# Patient Record
Sex: Male | Born: 1965 | Race: White | Hispanic: No
Health system: Southern US, Community
[De-identification: ages and names within clinical notes are randomized; demographics above are authoritative.]

---

## 2021-09-07 ENCOUNTER — Other Ambulatory Visit (HOSPITAL_COMMUNITY): Payer: Medicaid Other

## 2021-09-07 ENCOUNTER — Institutional Professional Consult (permissible substitution)
Admit: 2021-09-07 | Discharge: 2021-10-18 | Disposition: A | Discharge status: Rehabilitation Facility | Payer: Medicaid Other | Source: Other Acute Inpatient Hospital

## 2021-09-07 DIAGNOSIS — F10139 Alcohol abuse with withdrawal, unspecified: Secondary | ICD-10-CM

## 2021-09-07 DIAGNOSIS — J690 Pneumonitis due to inhalation of food and vomit: Secondary | ICD-10-CM

## 2021-09-07 DIAGNOSIS — Z931 Gastrostomy status: Secondary | ICD-10-CM

## 2021-09-07 DIAGNOSIS — T81507A Unspecified complication of foreign body accidentally left in body following removal of catheter or packing, initial encounter: Secondary | ICD-10-CM

## 2021-09-07 DIAGNOSIS — T17908A Unspecified foreign body in respiratory tract, part unspecified causing other injury, initial encounter: Secondary | ICD-10-CM

## 2021-09-07 DIAGNOSIS — R609 Edema, unspecified: Secondary | ICD-10-CM

## 2021-09-07 DIAGNOSIS — K7460 Unspecified cirrhosis of liver: Secondary | ICD-10-CM

## 2021-09-07 DIAGNOSIS — K567 Ileus, unspecified: Secondary | ICD-10-CM

## 2021-09-07 DIAGNOSIS — Z4659 Encounter for fitting and adjustment of other gastrointestinal appliance and device: Secondary | ICD-10-CM

## 2021-09-07 DIAGNOSIS — R188 Other ascites: Secondary | ICD-10-CM

## 2021-09-07 DIAGNOSIS — J189 Pneumonia, unspecified organism: Secondary | ICD-10-CM

## 2021-09-07 DIAGNOSIS — T85598A Other mechanical complication of other gastrointestinal prosthetic devices, implants and grafts, initial encounter: Secondary | ICD-10-CM

## 2021-09-07 DIAGNOSIS — R52 Pain, unspecified: Secondary | ICD-10-CM

## 2021-09-07 DIAGNOSIS — R110 Nausea: Secondary | ICD-10-CM

## 2021-09-07 DIAGNOSIS — G9341 Metabolic encephalopathy: Secondary | ICD-10-CM

## 2021-09-07 DIAGNOSIS — J9621 Acute and chronic respiratory failure with hypoxia: Secondary | ICD-10-CM

## 2021-09-07 DIAGNOSIS — Z978 Presence of other specified devices: Secondary | ICD-10-CM

## 2021-09-07 DIAGNOSIS — R1110 Vomiting, unspecified: Secondary | ICD-10-CM

## 2021-09-07 DIAGNOSIS — R079 Chest pain, unspecified: Secondary | ICD-10-CM

## 2021-09-07 DIAGNOSIS — J9690 Respiratory failure, unspecified, unspecified whether with hypoxia or hypercapnia: Secondary | ICD-10-CM

## 2021-09-07 DIAGNOSIS — K7031 Alcoholic cirrhosis of liver with ascites: Secondary | ICD-10-CM

## 2021-09-07 DIAGNOSIS — R112 Nausea with vomiting, unspecified: Secondary | ICD-10-CM

## 2021-09-07 LAB — BLOOD GAS, ARTERIAL
Acid-base deficit: 5.2 mmol/L — ABNORMAL HIGH (ref 0.0–2.0)
Bicarbonate: 18.5 mmol/L — ABNORMAL LOW (ref 20.0–28.0)
FIO2: 40
O2 Saturation: 98 %
Patient temperature: 37.4
pCO2 arterial: 30.1 mmHg — ABNORMAL LOW (ref 32.0–48.0)
pH, Arterial: 7.409 (ref 7.350–7.450)
pO2, Arterial: 105 mmHg (ref 83.0–108.0)

## 2021-09-07 MED ORDER — DIATRIZOATE MEGLUMINE & SODIUM 66-10 % PO SOLN
ORAL | Status: AC
  Filled 2021-09-07: qty 30

## 2021-09-08 ENCOUNTER — Other Ambulatory Visit (HOSPITAL_COMMUNITY): Payer: Medicaid Other

## 2021-09-08 DIAGNOSIS — J9621 Acute and chronic respiratory failure with hypoxia: Secondary | ICD-10-CM | POA: Diagnosis not present

## 2021-09-08 DIAGNOSIS — G9341 Metabolic encephalopathy: Secondary | ICD-10-CM

## 2021-09-08 DIAGNOSIS — K7031 Alcoholic cirrhosis of liver with ascites: Secondary | ICD-10-CM

## 2021-09-08 DIAGNOSIS — F10139 Alcohol abuse with withdrawal, unspecified: Secondary | ICD-10-CM

## 2021-09-08 LAB — CBC WITH DIFFERENTIAL/PLATELET
Abs Immature Granulocytes: 0.05 10*3/uL (ref 0.00–0.07)
Basophils Absolute: 0.1 10*3/uL (ref 0.0–0.1)
Basophils Relative: 1 %
Eosinophils Absolute: 0.7 10*3/uL — ABNORMAL HIGH (ref 0.0–0.5)
Eosinophils Relative: 7 %
HCT: 26.2 % — ABNORMAL LOW (ref 39.0–52.0)
Hemoglobin: 8.2 g/dL — ABNORMAL LOW (ref 13.0–17.0)
Immature Granulocytes: 1 %
Lymphocytes Relative: 15 %
Lymphs Abs: 1.5 10*3/uL (ref 0.7–4.0)
MCH: 29.8 pg (ref 26.0–34.0)
MCHC: 31.3 g/dL (ref 30.0–36.0)
MCV: 95.3 fL (ref 80.0–100.0)
Monocytes Absolute: 1.3 10*3/uL — ABNORMAL HIGH (ref 0.1–1.0)
Monocytes Relative: 13 %
Neutro Abs: 6.7 10*3/uL (ref 1.7–7.7)
Neutrophils Relative %: 63 %
Platelets: 324 10*3/uL (ref 150–400)
RBC: 2.75 MIL/uL — ABNORMAL LOW (ref 4.22–5.81)
RDW: 19.1 % — ABNORMAL HIGH (ref 11.5–15.5)
WBC: 10.4 10*3/uL (ref 4.0–10.5)
nRBC: 0 % (ref 0.0–0.2)

## 2021-09-08 LAB — COMPREHENSIVE METABOLIC PANEL
ALT: 49 U/L — ABNORMAL HIGH (ref 0–44)
AST: 55 U/L — ABNORMAL HIGH (ref 15–41)
Albumin: 2 g/dL — ABNORMAL LOW (ref 3.5–5.0)
Alkaline Phosphatase: 225 U/L — ABNORMAL HIGH (ref 38–126)
Anion gap: 8 (ref 5–15)
BUN: 38 mg/dL — ABNORMAL HIGH (ref 6–20)
CO2: 20 mmol/L — ABNORMAL LOW (ref 22–32)
Calcium: 8.6 mg/dL — ABNORMAL LOW (ref 8.9–10.3)
Chloride: 116 mmol/L — ABNORMAL HIGH (ref 98–111)
Creatinine, Ser: 0.83 mg/dL (ref 0.61–1.24)
GFR, Estimated: >60 mL/min (ref >60)
Glucose, Bld: 107 mg/dL — ABNORMAL HIGH (ref 70–99)
Potassium: 3.6 mmol/L (ref 3.5–5.1)
Sodium: 144 mmol/L (ref 135–145)
Total Bilirubin: 0.7 mg/dL (ref 0.3–1.2)
Total Protein: 6.3 g/dL — ABNORMAL LOW (ref 6.5–8.1)

## 2021-09-08 LAB — HEMOGLOBIN A1C
Hgb A1c MFr Bld: 5.8 % — ABNORMAL HIGH (ref 4.8–5.6)
Mean Plasma Glucose: 119.76 mg/dL

## 2021-09-08 LAB — PHOSPHORUS: Phosphorus: 4.1 mg/dL (ref 2.5–4.6)

## 2021-09-08 LAB — MAGNESIUM: Magnesium: 1.9 mg/dL (ref 1.7–2.4)

## 2021-09-08 LAB — PROTIME-INR
INR: 1.2 (ref 0.8–1.2)
Prothrombin Time: 15.5 seconds — ABNORMAL HIGH (ref 11.4–15.2)

## 2021-09-08 LAB — AMMONIA: Ammonia: 27 umol/L (ref 9–35)

## 2021-09-08 NOTE — Consult Note (Signed)
WhoPulmonary Critical Care Medicine [facility] [place]  PULMONARY SERVICE  Date of Service: 09/08/2021  PULMONARY CRITICAL CARE Leon Davidson  BOF:[id number]  DOB: 1965-12-24   DOA: 09/07/2021  Referring Physician: Luna Kitchens, MD  HPI: Leon Davidson is a 55 y.o. male seen for follow up of Acute on Chronic Respiratory Failure.  Patient with multiple medical problems including polysubstance abuse alcohol withdrawal ascites respiratory failure type 2 diabetes esophageal varices scented to the hospital after multiple falls and delirium.  Patient was admitted to the hospital intubated for mechanical ventilation had high oxygen requirements weaned down to a 40%.  Patient was placed on Precedex for withdrawal precautions.  The oxygen levels were waxing and waning eventually ended up in prone position ventilation.  The patient subsequently had to have a tracheostomy because he was not able to come off of mechanical ventilation because of multifactorial including sepsis as well as low blood pressure.  During the hospital stay also resulted in cardiac arrest with pulseless electrical activity CPR was done with return of circulation.  Transferred to our facility for further management and weaning.  Review of Systems:  ROS performed and is unremarkable other than noted above.  PAST MEDICAL HISTORY Past Medical History:  Diagnosis Date   Calculus of kidney   CHF (congestive heart failure) (HCC)   Coronary artery disease involving native coronary artery of native heart without angina pectoris 07/08/2015   Diabetes mellitus (HCC)   GERD (gastroesophageal reflux disease)   Hyperlipidemia   Hypertension   S/P triple vessel bypass    PAST SURGICAL HISTORY Past Surgical History:  Procedure Laterality Date   HX CORONARY ARTERY BYPASS GRAFT   HX HEART CATHETERIZATION   PR COLONOSCOPY FLX DX W/COLLJ SPEC WHEN PFRMD N/A 04/01/2020  COLONOSCOPY FLX DX W/COLLJ SPEC WHEN PFRMD  performed by Archie Balboa, MD at [facility] ENDO   PR ESOPHAGOGASTRODUODENOSCOPY TRANSORAL DIAGNOSTIC N/A 10/06/2015  ESOPHAGOGASTRODUODENOSCOPY TRANSORAL DIAGNOSTIC performed by Ardine Eng, MD at [facility] ENDO   PR ESOPHAGOGASTRODUODENOSCOPY TRANSORAL DIAGNOSTIC N/A 05/23/2018  ESOPHAGOGASTRODUODENOSCOPY TRANSORAL DIAGNOSTIC performed by Dani Gobble, MD at [facility] ENDO   PR ESOPHAGOGASTRODUODENOSCOPY TRANSORAL DIAGNOSTIC N/A 03/19/2020  ESOPHAGOGASTRODUODENOSCOPY TRANSORAL DIAGNOSTIC performed by Rolly Pancake, MD at [facility] ENDO   ALLERGIES Allergies  Allergen Reactions   Atorvastatin Muscle Pain     Medications: Reviewed on Rounds  Physical Exam:  Vitals: Temperature is 98.0 pulse 74 respiratory rate is 26 blood pressure is 94/63 saturations 100%  Ventilator Settings on assist control FiO2 is 40% tidal volume 500 PEEP of 8  General: Comfortable at this time Eyes: Grossly normal lids, irises & conjunctiva ENT: grossly tongue is normal Neck: no obvious mass Cardiovascular: S1-S2 normal no gallop Respiratory: Coarse rhonchi expansion is equal Abdomen: Soft and nontender Skin: no rash seen on limited exam Musculoskeletal: not rigid Psychiatric:unable to assess Neurologic: no seizure no involuntary movements         Labs on Admission:  Basic Metabolic Panel: Recent Labs  Lab 09/08/21 0304  NA 144  K 3.6  CL 116*  CO2 20*  GLUCOSE 107*  BUN 38*  CREATININE 0.83  CALCIUM 8.6*  MG 1.9  PHOS 4.1    Recent Labs  Lab 09/07/21 1824  PHART 7.409  PCO2ART 30.1*  PO2ART 105  HCO3 18.5*  O2SAT 98.0    Liver Function Tests: Recent Labs  Lab 09/08/21 0304  AST 55*  ALT 49*  ALKPHOS 225*  BILITOT 0.7  PROT 6.3*  ALBUMIN 2.0*  No results for input(s): LIPASE, AMYLASE in the last 168 hours. Recent Labs  Lab 09/08/21 0304  AMMONIA 27    CBC: Recent Labs  Lab 09/08/21 0304  WBC 10.4  NEUTROABS 6.7  HGB 8.2*  HCT 26.2*  MCV 95.3  PLT  324    Cardiac Enzymes: No results for input(s): CKTOTAL, CKMB, CKMBINDEX, TROPONINI in the last 168 hours.  BNP (last 3 results) No results for input(s): BNP in the last 8760 hours.  ProBNP (last 3 results) No results for input(s): PROBNP in the last 8760 hours.   Radiological Exams on Admission: DG Chest Port 1 View  Result Date: 09/07/2021 CLINICAL DATA:  Respiratory failure. EXAM: PORTABLE CHEST 1 VIEW COMPARISON:  None. FINDINGS: The heart size and mediastinal contours are within normal limits. Right-sided PICC line is noted with distal tip in expected position of cavoatrial junction. Tracheostomy and feeding tubes are noted. Hypoinflation of the lungs is noted with mild bibasilar subsegmental atelectasis. The visualized skeletal structures are unremarkable. IMPRESSION: Hypoinflation of the lungs with mild bibasilar subsegmental atelectasis. Electronically Signed   By: Lupita Raider M.D.   On: 09/07/2021 20:45   DG Abd Portable 1V  Result Date: 09/07/2021 CLINICAL DATA:  Impaired gastric feeding tube EXAM: PORTABLE ABDOMEN - 1 VIEW COMPARISON:  None. FINDINGS: The bowel gas pattern is normal. Distal tip of feeding tube is seen in expected position of proximal jejunum. No radio-opaque calculi or other significant radiographic abnormality are seen. IMPRESSION: Distal tip of feeding tube seen in expected position of proximal jejunum. Electronically Signed   By: Lupita Raider M.D.   On: 09/07/2021 20:46    Assessment/Plan Active Problems:   Acute on chronic respiratory failure with hypoxia (HCC)   Alcoholic cirrhosis of liver with ascites (HCC)   Acute metabolic encephalopathy   Aspiration pneumonia of both lower lobes due to gastric secretions (HCC)   Alcohol abuse with withdrawal (HCC)   Acute on chronic respiratory failure with hypoxia patient is going to continue with assist control for now respiratory therapy will assess the RSB and mechanics and try to start weaning.  With  the patient's history of polysubstance abuse may be difficult also patient has history of ascites and this may also limit weaning however we will see how he does if needed then paracentesis may be done also Acute metabolic encephalopathy multiple factors with history of alcohol abuse substance abuse hepatic encephalopathy patient will continue to be monitored. Cirrhosis of the liver with ascites patient has had ascites requiring paracentesis this may cause problems Pneumonia due to aspiration patient has been treated with antibiotics remains at risk Alcohol abuse no sign of active withdrawal right now patient is already gone through withdrawal  I have personally seen and evaluated the patient, evaluated laboratory and imaging results, formulated the assessment and plan and placed orders. The Patient requires high complexity decision making with multiple systems involvement.  Case was discussed on Rounds with the Respiratory Therapy Director and the Respiratory staff Time Spent  Yevonne Pax, MD [facility] Pulmonary Critical Care Medicine Sleep Medicine

## 2021-09-09 ENCOUNTER — Other Ambulatory Visit (HOSPITAL_COMMUNITY): Payer: Medicaid Other

## 2021-09-09 DIAGNOSIS — J690 Pneumonitis due to inhalation of food and vomit: Secondary | ICD-10-CM

## 2021-09-09 DIAGNOSIS — G9341 Metabolic encephalopathy: Secondary | ICD-10-CM | POA: Diagnosis not present

## 2021-09-09 DIAGNOSIS — F10139 Alcohol abuse with withdrawal, unspecified: Secondary | ICD-10-CM

## 2021-09-09 DIAGNOSIS — J9621 Acute and chronic respiratory failure with hypoxia: Secondary | ICD-10-CM | POA: Diagnosis not present

## 2021-09-09 DIAGNOSIS — K7031 Alcoholic cirrhosis of liver with ascites: Secondary | ICD-10-CM | POA: Diagnosis not present

## 2021-09-09 HISTORY — PX: IR PARACENTESIS: IMG2679

## 2021-09-09 LAB — PROTEIN, PLEURAL OR PERITONEAL FLUID: Total protein, fluid: <3 g/dL

## 2021-09-09 LAB — GRAM STAIN

## 2021-09-09 LAB — GLUCOSE, PLEURAL OR PERITONEAL FLUID: Glucose, Fluid: 172 mg/dL

## 2021-09-09 MED ORDER — LIDOCAINE HCL 1 % IJ SOLN
INTRAMUSCULAR | Status: AC
  Administered 2021-09-09: 10 mL via SUBCUTANEOUS
  Filled 2021-09-09: qty 20

## 2021-09-09 NOTE — Procedures (Signed)
PROCEDURE SUMMARY:  Successful US guided diagnostic and therapeutic paracentesis from RLQ.  Yielded 3.7 L of clear, yellow fluid.  No immediate complications.  Pt tolerated well.   Specimen was sent for labs.  EBL < 15mL  Shon Hough, NP 09/09/2021 4:32 PM

## 2021-09-09 NOTE — Progress Notes (Signed)
Pulmonary Critical Care Medicine [facility] [place]   PULMONARY CRITICAL CARE SERVICE  PROGRESS NOTE     Leon Davidson  DTO:[id number]  DOB: 14-Dec-1965   DOA: 09/07/2021  Referring Physician: Luna Kitchens, MD  HPI: Leon Davidson is a 55 y.o. male being followed for ventilator/airway/oxygen weaning Acute on Chronic Respiratory Failure.  Patient at this time is on pressure pressure 12/5 has goal is for 4 hours today  Medications: Reviewed on Rounds  Physical Exam:  Vitals: Temperature is 97.1 pulse 106 respiratory 24 blood pressure is 132/74 saturations 100%  Ventilator Settings pressure support 12/5  General: Comfortable at this time Neck: supple Cardiovascular: no malignant arrhythmias Respiratory: Scattered rhonchi expansion is equal Skin: no rash seen on limited exam Musculoskeletal: No gross abnormality Psychiatric:unable to assess Neurologic:no involuntary movements         Lab Data:   Basic Metabolic Panel: Recent Labs  Lab 09/08/21 0304  NA 144  K 3.6  CL 116*  CO2 20*  GLUCOSE 107*  BUN 38*  CREATININE 0.83  CALCIUM 8.6*  MG 1.9  PHOS 4.1    ABG: Recent Labs  Lab 09/07/21 1824  PHART 7.409  PCO2ART 30.1*  PO2ART 105  HCO3 18.5*  O2SAT 98.0    Liver Function Tests: Recent Labs  Lab 09/08/21 0304  AST 55*  ALT 49*  ALKPHOS 225*  BILITOT 0.7  PROT 6.3*  ALBUMIN 2.0*   No results for input(s): LIPASE, AMYLASE in the last 168 hours. Recent Labs  Lab 09/08/21 0304  AMMONIA 27    CBC: Recent Labs  Lab 09/08/21 0304  WBC 10.4  NEUTROABS 6.7  HGB 8.2*  HCT 26.2*  MCV 95.3  PLT 324    Cardiac Enzymes: No results for input(s): CKTOTAL, CKMB, CKMBINDEX, TROPONINI in the last 168 hours.  BNP (last 3 results) No results for input(s): BNP in the last 8760 hours.  ProBNP (last 3 results) No results for input(s): PROBNP in the last 8760 hours.  Radiological Exams: DG Chest Port 1 View  Result Date:  09/07/2021 CLINICAL DATA:  Respiratory failure. EXAM: PORTABLE CHEST 1 VIEW COMPARISON:  None. FINDINGS: The heart size and mediastinal contours are within normal limits. Right-sided PICC line is noted with distal tip in expected position of cavoatrial junction. Tracheostomy and feeding tubes are noted. Hypoinflation of the lungs is noted with mild bibasilar subsegmental atelectasis. The visualized skeletal structures are unremarkable. IMPRESSION: Hypoinflation of the lungs with mild bibasilar subsegmental atelectasis. Electronically Signed   By: Lupita Raider M.D.   On: 09/07/2021 20:45   DG Abd Portable 1V  Result Date: 09/07/2021 CLINICAL DATA:  Impaired gastric feeding tube EXAM: PORTABLE ABDOMEN - 1 VIEW COMPARISON:  None. FINDINGS: The bowel gas pattern is normal. Distal tip of feeding tube is seen in expected position of proximal jejunum. No radio-opaque calculi or other significant radiographic abnormality are seen. IMPRESSION: Distal tip of feeding tube seen in expected position of proximal jejunum. Electronically Signed   By: Lupita Raider M.D.   On: 09/07/2021 20:46   US Abdomen Limited RUQ (LIVER/GB)  Result Date: 09/08/2021 CLINICAL DATA:  Cirrhosis, ascites EXAM: ULTRASOUND ABDOMEN LIMITED RIGHT UPPER QUADRANT COMPARISON:  None. FINDINGS: Gallbladder: No gallstones or wall thickening visualized. No sonographic Murphy sign noted by sonographer. Common bile duct: Diameter: 4 mm in proximal diameter Liver: Serosal nodularity and coarsening of the hepatic echotexture is in keeping with changes of underlying cirrhosis. No focal intrahepatic mass identified. No intrahepatic biliary  ductal dilation. Portal vein is patent on color Doppler imaging with normal direction of blood flow towards the liver. Other: Moderate ascites. IMPRESSION: Morphologic changes in keeping with cirrhosis. No focal intrahepatic mass. Moderate ascites. Electronically Signed   By: Helyn Numbers M.D.   On: 09/08/2021 19:26     Assessment/Plan Active Problems:   Acute on chronic respiratory failure with hypoxia (HCC)   Alcoholic cirrhosis of liver with ascites (HCC)   Acute metabolic encephalopathy   Aspiration pneumonia of both lower lobes due to gastric secretions (HCC)   Alcohol abuse with withdrawal (HCC)   Acute on chronic respiratory failure hypoxia try to wean on pressure support as tolerated so far tolerating it well however need to be aware of possibility of worsening ascites Alcohol cirrhosis supportive care we will continue to monitor closely. Acute metabolic encephalopathy no change we will continue with present therapy Aspiration pneumonia has been treated Alcohol abuse no signs of active withdrawal right now   I have personally seen and evaluated the patient, evaluated laboratory and imaging results, formulated the assessment and plan and placed orders. The Patient requires high complexity decision making with multiple systems involvement.  Rounds were done with the Respiratory Therapy Director and Staff therapists and discussed with nursing staff also.  Yevonne Pax, MD [facility] Pulmonary Critical Care Medicine Sleep Medicine

## 2021-09-10 LAB — AMYLASE, PLEURAL OR PERITONEAL FLUID: Amylase, Fluid: 24 U/L

## 2021-09-11 ENCOUNTER — Other Ambulatory Visit (HOSPITAL_COMMUNITY): Payer: Medicaid Other

## 2021-09-12 ENCOUNTER — Other Ambulatory Visit (HOSPITAL_COMMUNITY): Payer: Medicaid Other

## 2021-09-12 DIAGNOSIS — K7031 Alcoholic cirrhosis of liver with ascites: Secondary | ICD-10-CM | POA: Diagnosis not present

## 2021-09-12 DIAGNOSIS — J9621 Acute and chronic respiratory failure with hypoxia: Secondary | ICD-10-CM | POA: Diagnosis not present

## 2021-09-12 DIAGNOSIS — F10139 Alcohol abuse with withdrawal, unspecified: Secondary | ICD-10-CM | POA: Diagnosis not present

## 2021-09-12 DIAGNOSIS — G9341 Metabolic encephalopathy: Secondary | ICD-10-CM | POA: Diagnosis not present

## 2021-09-12 LAB — BASIC METABOLIC PANEL
Anion gap: 7 (ref 5–15)
BUN: 32 mg/dL — ABNORMAL HIGH (ref 6–20)
CO2: 26 mmol/L (ref 22–32)
Calcium: 9.2 mg/dL (ref 8.9–10.3)
Chloride: 114 mmol/L — ABNORMAL HIGH (ref 98–111)
Creatinine, Ser: 0.71 mg/dL (ref 0.61–1.24)
GFR, Estimated: >60 mL/min (ref >60)
Glucose, Bld: 177 mg/dL — ABNORMAL HIGH (ref 70–99)
Potassium: 3.8 mmol/L (ref 3.5–5.1)
Sodium: 147 mmol/L — ABNORMAL HIGH (ref 135–145)

## 2021-09-12 LAB — CBC
HCT: 29.7 % — ABNORMAL LOW (ref 39.0–52.0)
Hemoglobin: 9.2 g/dL — ABNORMAL LOW (ref 13.0–17.0)
MCH: 29.3 pg (ref 26.0–34.0)
MCHC: 31 g/dL (ref 30.0–36.0)
MCV: 94.6 fL (ref 80.0–100.0)
Platelets: 316 10*3/uL (ref 150–400)
RBC: 3.14 MIL/uL — ABNORMAL LOW (ref 4.22–5.81)
RDW: 17.7 % — ABNORMAL HIGH (ref 11.5–15.5)
WBC: 9.4 10*3/uL (ref 4.0–10.5)
nRBC: 0 % (ref 0.0–0.2)

## 2021-09-12 LAB — MAGNESIUM: Magnesium: 1.8 mg/dL (ref 1.7–2.4)

## 2021-09-12 NOTE — Progress Notes (Signed)
Pulmonary Critical Care Medicine [facility] [place]   PULMONARY CRITICAL CARE SERVICE  PROGRESS NOTE     Leon Davidson  NWG:[id number]  DOB: 06-22-1966   DOA: 09/07/2021  Referring Physician: Luna Kitchens, MD  HPI: Leon Davidson is a 55 y.o. male being followed for ventilator/airway/oxygen weaning Acute on Chronic Respiratory Failure.  Patient is comfortable right now without distress at this time has been on T collar 28% FiO2  Medications: Reviewed on Rounds  Physical Exam:  Vitals: Temperature is 97.9 pulse 82 respiratory rate 17 blood pressure is 145/87 saturations 98%  Ventilator Settings on pressure support FiO2 is 30% pressure 12/5  General: Comfortable at this time Neck: supple Cardiovascular: no malignant arrhythmias Respiratory: No rhonchi very coarse breath sounds Skin: no rash seen on limited exam Musculoskeletal: No gross abnormality Psychiatric:unable to assess Neurologic:no involuntary movements         Lab Data:   Basic Metabolic Panel: Recent Labs  Lab 09/08/21 0304 09/12/21 0426  NA 144 147*  K 3.6 3.8  CL 116* 114*  CO2 20* 26  GLUCOSE 107* 177*  BUN 38* 32*  CREATININE 0.83 0.71  CALCIUM 8.6* 9.2  MG 1.9 1.8  PHOS 4.1  --     ABG: Recent Labs  Lab 09/07/21 1824  PHART 7.409  PCO2ART 30.1*  PO2ART 105  HCO3 18.5*  O2SAT 98.0    Liver Function Tests: Recent Labs  Lab 09/08/21 0304  AST 55*  ALT 49*  ALKPHOS 225*  BILITOT 0.7  PROT 6.3*  ALBUMIN 2.0*   No results for input(s): LIPASE, AMYLASE in the last 168 hours. Recent Labs  Lab 09/08/21 0304  AMMONIA 27    CBC: Recent Labs  Lab 09/08/21 0304 09/12/21 0426  WBC 10.4 9.4  NEUTROABS 6.7  --   HGB 8.2* 9.2*  HCT 26.2* 29.7*  MCV 95.3 94.6  PLT 324 316    Cardiac Enzymes: No results for input(s): CKTOTAL, CKMB, CKMBINDEX, TROPONINI in the last 168 hours.  BNP (last 3 results) No results for input(s): BNP in the last 8760 hours.  ProBNP  (last 3 results) No results for input(s): PROBNP in the last 8760 hours.  Radiological Exams: DG Abd Portable 1V  Result Date: 09/11/2021 CLINICAL DATA:  55 year old male status post feeding tube placement. EXAM: PORTABLE ABDOMEN - 1 VIEW COMPARISON:  Abdominal radiograph 09/07/2021. FINDINGS: Small bore feeding tube in position with tip in the antrum of the stomach. Visualized bowel gas pattern is unremarkable. IMPRESSION: 1. Tip of feeding tube is in the antrum of the stomach. Electronically Signed   By: Trudie Reed M.D.   On: 09/11/2021 06:33    Assessment/Plan Active Problems:   Acute on chronic respiratory failure with hypoxia (HCC)   Alcoholic cirrhosis of liver with ascites (HCC)   Acute metabolic encephalopathy   Aspiration pneumonia of both lower lobes due to gastric secretions (HCC)   Alcohol abuse with withdrawal (HCC)   Acute on chronic respiratory failure with hypoxia goal of 16 hours on the pressure support. Alcoholic cirrhosis we will continue with current management. Metabolic encephalopathy no change continue with supportive care Aspiration pneumonia treated slow improvement Alcohol abuse continue with present management no sign of active withdrawal   I have personally seen and evaluated the patient, evaluated laboratory and imaging results, formulated the assessment and plan and placed orders. The Patient requires high complexity decision making with multiple systems involvement.  Rounds were done with the Respiratory Therapy Director and Staff therapists  and discussed with nursing staff also.  Allyne Gee, MD [facility] Pulmonary Critical Care Medicine Sleep Medicine

## 2021-09-13 ENCOUNTER — Other Ambulatory Visit (HOSPITAL_COMMUNITY): Payer: Medicaid Other

## 2021-09-13 DIAGNOSIS — J9621 Acute and chronic respiratory failure with hypoxia: Secondary | ICD-10-CM | POA: Diagnosis not present

## 2021-09-13 DIAGNOSIS — G9341 Metabolic encephalopathy: Secondary | ICD-10-CM | POA: Diagnosis not present

## 2021-09-13 DIAGNOSIS — F10139 Alcohol abuse with withdrawal, unspecified: Secondary | ICD-10-CM | POA: Diagnosis not present

## 2021-09-13 DIAGNOSIS — K7031 Alcoholic cirrhosis of liver with ascites: Secondary | ICD-10-CM | POA: Diagnosis not present

## 2021-09-13 LAB — BASIC METABOLIC PANEL
Anion gap: 10 (ref 5–15)
BUN: 29 mg/dL — ABNORMAL HIGH (ref 6–20)
CO2: 26 mmol/L (ref 22–32)
Calcium: 9.1 mg/dL (ref 8.9–10.3)
Chloride: 113 mmol/L — ABNORMAL HIGH (ref 98–111)
Creatinine, Ser: 0.86 mg/dL (ref 0.61–1.24)
GFR, Estimated: >60 mL/min (ref >60)
Glucose, Bld: 198 mg/dL — ABNORMAL HIGH (ref 70–99)
Potassium: 3.8 mmol/L (ref 3.5–5.1)
Sodium: 149 mmol/L — ABNORMAL HIGH (ref 135–145)

## 2021-09-13 LAB — CBC
HCT: 28.9 % — ABNORMAL LOW (ref 39.0–52.0)
Hemoglobin: 8.9 g/dL — ABNORMAL LOW (ref 13.0–17.0)
MCH: 29.4 pg (ref 26.0–34.0)
MCHC: 30.8 g/dL (ref 30.0–36.0)
MCV: 95.4 fL (ref 80.0–100.0)
Platelets: 257 10*3/uL (ref 150–400)
RBC: 3.03 MIL/uL — ABNORMAL LOW (ref 4.22–5.81)
RDW: 17.5 % — ABNORMAL HIGH (ref 11.5–15.5)
WBC: 10.6 10*3/uL — ABNORMAL HIGH (ref 4.0–10.5)
nRBC: 0 % (ref 0.0–0.2)

## 2021-09-13 LAB — AMMONIA: Ammonia: 28 umol/L (ref 9–35)

## 2021-09-13 LAB — PHOSPHORUS: Phosphorus: 3.3 mg/dL (ref 2.5–4.6)

## 2021-09-13 LAB — MAGNESIUM: Magnesium: 1.7 mg/dL (ref 1.7–2.4)

## 2021-09-13 NOTE — Progress Notes (Signed)
Pulmonary Critical Care Medicine [facility] [place]   PULMONARY CRITICAL CARE SERVICE  PROGRESS NOTE     Jerald Hennington  HQP:[id number]  DOB: [birth year]   DOA: 09/07/2021  Referring Physician: Luna Kitchens, MD  HPI: Leon Davidson is a 55 y.o. male being followed for ventilator/airway/oxygen weaning Acute on Chronic Respiratory Failure.  Patient is currently full support assist control mode apparently having some issues with vomiting  Medications: Reviewed on Rounds  Physical Exam:  Vitals: Temperature is 97.3 pulse 74 respiratory 20 blood pressure is 132/68 saturations 100%  Ventilator Settings on assist control FiO2 is 40% tidal volume 500 PEEP 5  General: Comfortable at this time Neck: supple Cardiovascular: no malignant arrhythmias Respiratory: Scattered rhonchi expansion is equal Skin: no rash seen on limited exam Musculoskeletal: No gross abnormality Psychiatric:unable to assess Neurologic:no involuntary movements         Lab Data:   Basic Metabolic Panel: Recent Labs  Lab 09/08/21 0304 09/12/21 0426 09/13/21 0339  NA 144 147* 149*  K 3.6 3.8 3.8  CL 116* 114* 113*  CO2 20* 26 26  GLUCOSE 107* 177* 198*  BUN 38* 32* 29*  CREATININE 0.83 0.71 0.86  CALCIUM 8.6* 9.2 9.1  MG 1.9 1.8 1.7  PHOS 4.1  --  3.3    ABG: Recent Labs  Lab 09/07/21 1824  PHART 7.409  PCO2ART 30.1*  PO2ART 105  HCO3 18.5*  O2SAT 98.0    Liver Function Tests: Recent Labs  Lab 09/08/21 0304  AST 55*  ALT 49*  ALKPHOS 225*  BILITOT 0.7  PROT 6.3*  ALBUMIN 2.0*   No results for input(s): LIPASE, AMYLASE in the last 168 hours. Recent Labs  Lab 09/08/21 0304 09/13/21 0339  AMMONIA 27 28    CBC: Recent Labs  Lab 09/08/21 0304 09/12/21 0426 09/13/21 0339  WBC 10.4 9.4 10.6*  NEUTROABS 6.7  --   --   HGB 8.2* 9.2* 8.9*  HCT 26.2* 29.7* 28.9*  MCV 95.3 94.6 95.4  PLT 324 316 257    Cardiac Enzymes: No results for input(s): CKTOTAL, CKMB,  CKMBINDEX, TROPONINI in the last 168 hours.  BNP (last 3 results) No results for input(s): BNP in the last 8760 hours.  ProBNP (last 3 results) No results for input(s): PROBNP in the last 8760 hours.  Radiological Exams: CT ABDOMEN PELVIS WO CONTRAST  Result Date: 09/12/2021 CLINICAL DATA:  Bowel obstruction suspected. EXAM: CT ABDOMEN AND PELVIS WITHOUT CONTRAST TECHNIQUE: Multidetector CT imaging of the abdomen and pelvis was performed following the standard protocol without IV contrast. COMPARISON:  X-ray abdomen 09/12/2021 FINDINGS: Lower chest: Bilateral trace pleural effusions. Left lower lobe consolidation. Hepatobiliary: Nodular hepatic contour. No focal liver abnormality. No gallstones, gallbladder wall thickening, or pericholecystic fluid. No biliary dilatation. Pancreas: No focal lesion. Normal pancreatic contour. No surrounding inflammatory changes. No main pancreatic ductal dilatation. Spleen: The spleen is enlarged measuring up to 14 cm. No focal splenic lesion. Adrenals/Urinary Tract: No adrenal nodule bilaterally. No nephrolithiasis and no hydronephrosis. No definite contour-deforming renal mass. No ureterolithiasis or hydroureter. Foley catheter lumen and balloon terminate within the urinary bladder. Stomach/Bowel: Enteric tube with tip terminating within the gastric lumen. Stomach is within normal limits. Several loops of small bowel distended with gas measuring up to 3.5 cm in diameter with no definite transition point. No evidence of bowel wall thickening or dilatation. Few scattered colonic diverticula. No pneumatosis. Appendix appears normal. A rectal tube terminates within the rectal lumen. Vascular/Lymphatic: No abdominal aorta  or iliac aneurysm. Moderate to severe atherosclerotic plaque of the aorta and its branches. No abdominal, pelvic, or inguinal lymphadenopathy. Reproductive: Prostate is unremarkable. Other: At least small volume simple free fluid ascites. No intraperitoneal  free gas. No organized fluid collection. Musculoskeletal: No abdominal wall hernia or abnormality. No suspicious lytic or blastic osseous lesions. Age-indeterminate nondisplaced fracture of the right L2 and L3 transverse processes. No acute displaced fracture. Sternotomy wires noted. IMPRESSION: 1. Several loops of small bowel distended with gas measuring up to 3.5 cm in diameter with no definite transition point. Finding may represent partial/early small bowel obstruction. Differential diagnosis includes ileus. Limited evaluation on this noncontrast study. 2. Left lower lobe consolidation which may represent a combination of infection and atelectasis. Limited evaluation on this noncontrast study. 3. Cirrhosis with portal hypertension. Markedly limited evaluation for focal hepatic lesion. Recommend nonemergent MRI liver protocol. When the patient is clinically stable and able to follow directions and hold their breath (preferably as an outpatient) further evaluation with dedicated abdominal MRI should be considered. 4. At least small volume simple free fluid ascites. 5. Few scattered colonic diverticula with no acute diverticulitis. 6. Aortic Atherosclerosis (ICD10-I70.0). 7. Age-indeterminate nondisplaced fracture of the right L2 and L3 transverse processes. Correlate with point tenderness to evaluate for acute component. 8. Enteric tube with tip terminating within the gastric lumen. Electronically Signed   By: Tish Frederickson M.D.   On: 09/12/2021 18:57   DG Abd 1 View  Result Date: 09/13/2021 CLINICAL DATA:  Follow-up ileus. EXAM: ABDOMEN - 1 VIEW COMPARISON:  CT scan 09/12/2021 FINDINGS: Persistent air-filled small bowel loops and some scattered air in the transverse colon. No significant distension. No free air. Diffuse opacity throughout the abdomen consistent with known ascites. The feeding tube tip is in the fundal region of the stomach. IMPRESSION: Persistent mild ileus bowel gas pattern.  Electronically Signed   By: Rudie Meyer M.D.   On: 09/13/2021 06:43   DG Chest Port 1 View  Result Date: 09/12/2021 CLINICAL DATA:  Nausea and vomiting, aspiration EXAM: PORTABLE CHEST 1 VIEW COMPARISON:  09/07/2021 FINDINGS: Single frontal view of the chest demonstrates tracheostomy tube unchanged. Enteric catheter passes below diaphragm tip excluded by collimation. Cardiac silhouette remains enlarged. Persistent left lower lobe consolidation and likely small effusion, with slight increased since prior study. Right chest is clear. No pneumothorax. IMPRESSION: 1. Increasing left basilar consolidation and effusion. This could represent aspiration given clinical history. Electronically Signed   By: Sharlet Salina M.D.   On: 09/12/2021 16:19   DG Abd Portable 1V  Result Date: 09/12/2021 CLINICAL DATA:  Nausea and vomiting, aspiration EXAM: PORTABLE ABDOMEN - 1 VIEW COMPARISON:  09/11/2021 FINDINGS: Two supine frontal views of the abdomen and pelvis demonstrate weighted tip of an enteric feeding catheter overlying the gastric body. There is gaseous distention of the stomach and small bowel consistent with small-bowel obstruction. Maximal diameter of the small bowel measuring up to 4 cm. Paucity of colonic gas. No masses or abnormal calcifications. IMPRESSION: 1. Gaseous distention of the small bowel consistent with obstruction. 2. Enteric catheter tip overlying gastric body. Electronically Signed   By: Sharlet Salina M.D.   On: 09/12/2021 16:18    Assessment/Plan Active Problems:   Acute on chronic respiratory failure with hypoxia (HCC)   Alcoholic cirrhosis of liver with ascites (HCC)   Acute metabolic encephalopathy   Aspiration pneumonia of both lower lobes due to gastric secretions (HCC)   Alcohol abuse with withdrawal (HCC)   Acute  on chronic respiratory failure with hypoxia the patient currently is on full support on the ventilator.  Of asked respiratory therapy to hold off on weaning for  now until his GI issues are improved Alcoholic cirrhosis supportive care monitor fluid status closely Metabolic encephalopathy no change Aspiration pneumonia has been treated with antibiotics still having some issues with vomiting need to continue with aspiration precautions Alcohol abuse no sign of active withdrawal right now   I have personally seen and evaluated the patient, evaluated laboratory and imaging results, formulated the assessment and plan and placed orders. The Patient requires high complexity decision making with multiple systems involvement.  Rounds were done with the Respiratory Therapy Director and Staff therapists and discussed with nursing staff also.  Yevonne Pax, MD [facility] Pulmonary Critical Care Medicine Sleep Medicine

## 2021-09-14 ENCOUNTER — Other Ambulatory Visit (HOSPITAL_COMMUNITY): Payer: Medicaid Other

## 2021-09-14 DIAGNOSIS — J9621 Acute and chronic respiratory failure with hypoxia: Secondary | ICD-10-CM | POA: Diagnosis not present

## 2021-09-14 DIAGNOSIS — K7031 Alcoholic cirrhosis of liver with ascites: Secondary | ICD-10-CM | POA: Diagnosis not present

## 2021-09-14 DIAGNOSIS — F10139 Alcohol abuse with withdrawal, unspecified: Secondary | ICD-10-CM | POA: Diagnosis not present

## 2021-09-14 DIAGNOSIS — G9341 Metabolic encephalopathy: Secondary | ICD-10-CM | POA: Diagnosis not present

## 2021-09-14 LAB — CBC
HCT: 29.7 % — ABNORMAL LOW (ref 39.0–52.0)
Hemoglobin: 9.1 g/dL — ABNORMAL LOW (ref 13.0–17.0)
MCH: 28.9 pg (ref 26.0–34.0)
MCHC: 30.6 g/dL (ref 30.0–36.0)
MCV: 94.3 fL (ref 80.0–100.0)
Platelets: 214 10*3/uL (ref 150–400)
RBC: 3.15 MIL/uL — ABNORMAL LOW (ref 4.22–5.81)
RDW: 17.1 % — ABNORMAL HIGH (ref 11.5–15.5)
WBC: 7.8 10*3/uL (ref 4.0–10.5)
nRBC: 0 % (ref 0.0–0.2)

## 2021-09-14 LAB — BASIC METABOLIC PANEL
Anion gap: 7 (ref 5–15)
BUN: 19 mg/dL (ref 6–20)
CO2: 26 mmol/L (ref 22–32)
Calcium: 8.5 mg/dL — ABNORMAL LOW (ref 8.9–10.3)
Chloride: 110 mmol/L (ref 98–111)
Creatinine, Ser: 0.81 mg/dL (ref 0.61–1.24)
GFR, Estimated: >60 mL/min (ref >60)
Glucose, Bld: 123 mg/dL — ABNORMAL HIGH (ref 70–99)
Potassium: 3.1 mmol/L — ABNORMAL LOW (ref 3.5–5.1)
Sodium: 143 mmol/L (ref 135–145)

## 2021-09-14 LAB — CULTURE, BODY FLUID W GRAM STAIN -BOTTLE: Culture: NO GROWTH

## 2021-09-14 LAB — PHOSPHORUS: Phosphorus: 3.1 mg/dL (ref 2.5–4.6)

## 2021-09-14 LAB — MAGNESIUM: Magnesium: 2 mg/dL (ref 1.7–2.4)

## 2021-09-14 NOTE — Progress Notes (Signed)
Pulmonary Critical Care Medicine [facility] [place]   PULMONARY CRITICAL CARE SERVICE  PROGRESS NOTE     Leon Davidson  SEG:[id number]  DOB: Oct 06, 1966   DOA: 09/07/2021  Referring Physician: Luna Kitchens, MD  HPI: Leon Davidson is a 55 y.o. male being followed for ventilator/airway/oxygen weaning Acute on Chronic Respiratory Failure.  At this time patient is afebrile without distress has been on assist control mode.  Not tolerating weaning this morning  Medications: Reviewed on Rounds  Physical Exam:  Vitals: Temperature is 97.1 pulse 63 respiratory rate is 14 blood pressure is 127/78 saturations 100%  Ventilator Settings on assist control FiO2 40% tidal volume 500 PEEP 5  General: Comfortable at this time Neck: supple Cardiovascular: no malignant arrhythmias Respiratory: Scattered rhonchi expansion is equal Skin: no rash seen on limited exam Musculoskeletal: No gross abnormality Psychiatric:unable to assess Neurologic:no involuntary movements         Lab Data:   Basic Metabolic Panel: Recent Labs  Lab 09/08/21 0304 09/12/21 0426 09/13/21 0339 09/14/21 0405  NA 144 147* 149* 143  K 3.6 3.8 3.8 3.1*  CL 116* 114* 113* 110  CO2 20* 26 26 26   GLUCOSE 107* 177* 198* 123*  BUN 38* 32* 29* 19  CREATININE 0.83 0.71 0.86 0.81  CALCIUM 8.6* 9.2 9.1 8.5*  MG 1.9 1.8 1.7 2.0  PHOS 4.1  --  3.3 3.1    ABG: Recent Labs  Lab 09/07/21 1824  PHART 7.409  PCO2ART 30.1*  PO2ART 105  HCO3 18.5*  O2SAT 98.0    Liver Function Tests: Recent Labs  Lab 09/08/21 0304  AST 55*  ALT 49*  ALKPHOS 225*  BILITOT 0.7  PROT 6.3*  ALBUMIN 2.0*   No results for input(s): LIPASE, AMYLASE in the last 168 hours. Recent Labs  Lab 09/08/21 0304 09/13/21 0339  AMMONIA 27 28    CBC: Recent Labs  Lab 09/08/21 0304 09/12/21 0426 09/13/21 0339 09/14/21 0528  WBC 10.4 9.4 10.6* 7.8  NEUTROABS 6.7  --   --   --   HGB 8.2* 9.2* 8.9* 9.1*  HCT 26.2* 29.7*  28.9* 29.7*  MCV 95.3 94.6 95.4 94.3  PLT 324 316 257 214    Cardiac Enzymes: No results for input(s): CKTOTAL, CKMB, CKMBINDEX, TROPONINI in the last 168 hours.  BNP (last 3 results) No results for input(s): BNP in the last 8760 hours.  ProBNP (last 3 results) No results for input(s): PROBNP in the last 8760 hours.  Radiological Exams: CT ABDOMEN PELVIS WO CONTRAST  Result Date: 09/12/2021 CLINICAL DATA:  Bowel obstruction suspected. EXAM: CT ABDOMEN AND PELVIS WITHOUT CONTRAST TECHNIQUE: Multidetector CT imaging of the abdomen and pelvis was performed following the standard protocol without IV contrast. COMPARISON:  X-ray abdomen 09/12/2021 FINDINGS: Lower chest: Bilateral trace pleural effusions. Left lower lobe consolidation. Hepatobiliary: Nodular hepatic contour. No focal liver abnormality. No gallstones, gallbladder wall thickening, or pericholecystic fluid. No biliary dilatation. Pancreas: No focal lesion. Normal pancreatic contour. No surrounding inflammatory changes. No main pancreatic ductal dilatation. Spleen: The spleen is enlarged measuring up to 14 cm. No focal splenic lesion. Adrenals/Urinary Tract: No adrenal nodule bilaterally. No nephrolithiasis and no hydronephrosis. No definite contour-deforming renal mass. No ureterolithiasis or hydroureter. Foley catheter lumen and balloon terminate within the urinary bladder. Stomach/Bowel: Enteric tube with tip terminating within the gastric lumen. Stomach is within normal limits. Several loops of small bowel distended with gas measuring up to 3.5 cm in diameter with no definite transition point.  No evidence of bowel wall thickening or dilatation. Few scattered colonic diverticula. No pneumatosis. Appendix appears normal. A rectal tube terminates within the rectal lumen. Vascular/Lymphatic: No abdominal aorta or iliac aneurysm. Moderate to severe atherosclerotic plaque of the aorta and its branches. No abdominal, pelvic, or inguinal  lymphadenopathy. Reproductive: Prostate is unremarkable. Other: At least small volume simple free fluid ascites. No intraperitoneal free gas. No organized fluid collection. Musculoskeletal: No abdominal wall hernia or abnormality. No suspicious lytic or blastic osseous lesions. Age-indeterminate nondisplaced fracture of the right L2 and L3 transverse processes. No acute displaced fracture. Sternotomy wires noted. IMPRESSION: 1. Several loops of small bowel distended with gas measuring up to 3.5 cm in diameter with no definite transition point. Finding may represent partial/early small bowel obstruction. Differential diagnosis includes ileus. Limited evaluation on this noncontrast study. 2. Left lower lobe consolidation which may represent a combination of infection and atelectasis. Limited evaluation on this noncontrast study. 3. Cirrhosis with portal hypertension. Markedly limited evaluation for focal hepatic lesion. Recommend nonemergent MRI liver protocol. When the patient is clinically stable and able to follow directions and hold their breath (preferably as an outpatient) further evaluation with dedicated abdominal MRI should be considered. 4. At least small volume simple free fluid ascites. 5. Few scattered colonic diverticula with no acute diverticulitis. 6. Aortic Atherosclerosis (ICD10-I70.0). 7. Age-indeterminate nondisplaced fracture of the right L2 and L3 transverse processes. Correlate with point tenderness to evaluate for acute component. 8. Enteric tube with tip terminating within the gastric lumen. Electronically Signed   By: Tish Frederickson M.D.   On: 09/12/2021 18:57   DG Abd 1 View  Result Date: 09/14/2021 CLINICAL DATA:  Abdominal ileus. EXAM: ABDOMEN - 1 VIEW COMPARISON:  09/13/2021 FINDINGS: The feeding tube is not visualized. Persistent scattered air throughout the small bowel and colon suggesting a persistent mild ileus. IMPRESSION: Persistent mild ileus. Electronically Signed   By: Rudie Meyer M.D.   On: 09/14/2021 05:58   DG Abd 1 View  Result Date: 09/13/2021 CLINICAL DATA:  Follow-up ileus. EXAM: ABDOMEN - 1 VIEW COMPARISON:  CT scan 09/12/2021 FINDINGS: Persistent air-filled small bowel loops and some scattered air in the transverse colon. No significant distension. No free air. Diffuse opacity throughout the abdomen consistent with known ascites. The feeding tube tip is in the fundal region of the stomach. IMPRESSION: Persistent mild ileus bowel gas pattern. Electronically Signed   By: Rudie Meyer M.D.   On: 09/13/2021 06:43   DG Chest Port 1 View  Result Date: 09/12/2021 CLINICAL DATA:  Nausea and vomiting, aspiration EXAM: PORTABLE CHEST 1 VIEW COMPARISON:  09/07/2021 FINDINGS: Single frontal view of the chest demonstrates tracheostomy tube unchanged. Enteric catheter passes below diaphragm tip excluded by collimation. Cardiac silhouette remains enlarged. Persistent left lower lobe consolidation and likely small effusion, with slight increased since prior study. Right chest is clear. No pneumothorax. IMPRESSION: 1. Increasing left basilar consolidation and effusion. This could represent aspiration given clinical history. Electronically Signed   By: Sharlet Salina M.D.   On: 09/12/2021 16:19   DG Abd Portable 1V  Result Date: 09/12/2021 CLINICAL DATA:  Nausea and vomiting, aspiration EXAM: PORTABLE ABDOMEN - 1 VIEW COMPARISON:  09/11/2021 FINDINGS: Two supine frontal views of the abdomen and pelvis demonstrate weighted tip of an enteric feeding catheter overlying the gastric body. There is gaseous distention of the stomach and small bowel consistent with small-bowel obstruction. Maximal diameter of the small bowel measuring up to 4 cm. Paucity of colonic  gas. No masses or abnormal calcifications. IMPRESSION: 1. Gaseous distention of the small bowel consistent with obstruction. 2. Enteric catheter tip overlying gastric body. Electronically Signed   By: Sharlet Salina M.D.    On: 09/12/2021 16:18    Assessment/Plan Active Problems:   Acute on chronic respiratory failure with hypoxia (HCC)   Alcoholic cirrhosis of liver with ascites (HCC)   Acute metabolic encephalopathy   Aspiration pneumonia of both lower lobes due to gastric secretions (HCC)   Alcohol abuse with withdrawal (HCC)   Acute on chronic respiratory failure with hypoxia we will continue with the full support on the ventilator for now has been having some difficulty with weaning Alcoholic cirrhosis supportive care no change abdominal films done some gaseous distention and ileus was noted Metabolic encephalopathy overall no change Aspiration pneumonia last chest x-ray looked good Alcohol abuse no sign of active withdrawal right now   I have personally seen and evaluated the patient, evaluated laboratory and imaging results, formulated the assessment and plan and placed orders. The Patient requires high complexity decision making with multiple systems involvement.  Rounds were done with the Respiratory Therapy Director and Staff therapists and discussed with nursing staff also.  Yevonne Pax, MD [facility] Pulmonary Critical Care Medicine Sleep Medicine

## 2021-09-15 DIAGNOSIS — K7031 Alcoholic cirrhosis of liver with ascites: Secondary | ICD-10-CM | POA: Diagnosis not present

## 2021-09-15 DIAGNOSIS — J9621 Acute and chronic respiratory failure with hypoxia: Secondary | ICD-10-CM | POA: Diagnosis not present

## 2021-09-15 DIAGNOSIS — G9341 Metabolic encephalopathy: Secondary | ICD-10-CM | POA: Diagnosis not present

## 2021-09-15 DIAGNOSIS — F10139 Alcohol abuse with withdrawal, unspecified: Secondary | ICD-10-CM | POA: Diagnosis not present

## 2021-09-15 LAB — POTASSIUM: Potassium: 3.5 mmol/L (ref 3.5–5.1)

## 2021-09-15 NOTE — Progress Notes (Signed)
Pulmonary Critical Care Medicine [facility] [place]   PULMONARY CRITICAL CARE SERVICE  PROGRESS NOTE     Leon Davidson  NUU:[id number]  DOB: 1966/10/21   DOA: 09/07/2021  Referring Physician: Luna Kitchens, MD  HPI: Leon Davidson is a 55 y.o. male being followed for ventilator/airway/oxygen weaning Acute on Chronic Respiratory Failure.  Patient currently is on assist control mode has been on 40% FiO2 secretions are copious  Medications: Reviewed on Rounds  Physical Exam:  Vitals: Temperature is 96.9 pulse 65 respiratory rate is 19 blood pressure 153/85 saturations 100%  Ventilator Settings on assist control FiO2 40% tidal volume 500 PEEP 5  General: Comfortable at this time Neck: supple Cardiovascular: no malignant arrhythmias Respiratory: Coarse breath sounds with a few scattered rhonchi Skin: no rash seen on limited exam Musculoskeletal: No gross abnormality Psychiatric:unable to assess Neurologic:no involuntary movements         Lab Data:   Basic Metabolic Panel: Recent Labs  Lab 09/12/21 0426 09/13/21 0339 09/14/21 0405 09/15/21 0104  NA 147* 149* 143  --   K 3.8 3.8 3.1* 3.5  CL 114* 113* 110  --   CO2 26 26 26   --   GLUCOSE 177* 198* 123*  --   BUN 32* 29* 19  --   CREATININE 0.71 0.86 0.81  --   CALCIUM 9.2 9.1 8.5*  --   MG 1.8 1.7 2.0  --   PHOS  --  3.3 3.1  --     ABG: No results for input(s): PHART, PCO2ART, PO2ART, HCO3, O2SAT in the last 168 hours.  Liver Function Tests: No results for input(s): AST, ALT, ALKPHOS, BILITOT, PROT, ALBUMIN in the last 168 hours. No results for input(s): LIPASE, AMYLASE in the last 168 hours. Recent Labs  Lab 09/13/21 0339  AMMONIA 28    CBC: Recent Labs  Lab 09/12/21 0426 09/13/21 0339 09/14/21 0528  WBC 9.4 10.6* 7.8  HGB 9.2* 8.9* 9.1*  HCT 29.7* 28.9* 29.7*  MCV 94.6 95.4 94.3  PLT 316 257 214    Cardiac Enzymes: No results for input(s): CKTOTAL, CKMB, CKMBINDEX, TROPONINI in  the last 168 hours.  BNP (last 3 results) No results for input(s): BNP in the last 8760 hours.  ProBNP (last 3 results) No results for input(s): PROBNP in the last 8760 hours.  Radiological Exams: DG Abd 1 View  Result Date: 09/14/2021 CLINICAL DATA:  Abdominal ileus. EXAM: ABDOMEN - 1 VIEW COMPARISON:  09/13/2021 FINDINGS: The feeding tube is not visualized. Persistent scattered air throughout the small bowel and colon suggesting a persistent mild ileus. IMPRESSION: Persistent mild ileus. Electronically Signed   By: 11/13/2021 M.D.   On: 09/14/2021 05:58    Assessment/Plan Active Problems:   Acute on chronic respiratory failure with hypoxia (HCC)   Alcoholic cirrhosis of liver with ascites (HCC)   Acute metabolic encephalopathy   Aspiration pneumonia of both lower lobes due to gastric secretions (HCC)   Alcohol abuse with withdrawal (HCC)   Acute on chronic respiratory failure with hypoxia we will continue with full support on the ventilator because of secretions not able to tolerate weaning. Alcohol cirrhosis supportive care we will continue to follow along closely Metabolic encephalopathy patient is at baseline Aspiration pneumonia treated Alcohol withdrawal supportive care   I have personally seen and evaluated the patient, evaluated laboratory and imaging results, formulated the assessment and plan and placed orders. The Patient requires high complexity decision making with multiple systems involvement.  Rounds were  done with the Respiratory Therapy Director and Staff therapists and discussed with nursing staff also.  Allyne Gee, MD [facility] Pulmonary Critical Care Medicine Sleep Medicine

## 2021-09-16 ENCOUNTER — Other Ambulatory Visit (HOSPITAL_COMMUNITY): Payer: Medicaid Other

## 2021-09-16 DIAGNOSIS — J9621 Acute and chronic respiratory failure with hypoxia: Secondary | ICD-10-CM | POA: Diagnosis not present

## 2021-09-16 DIAGNOSIS — G9341 Metabolic encephalopathy: Secondary | ICD-10-CM | POA: Diagnosis not present

## 2021-09-16 DIAGNOSIS — K7031 Alcoholic cirrhosis of liver with ascites: Secondary | ICD-10-CM | POA: Diagnosis not present

## 2021-09-16 DIAGNOSIS — F10139 Alcohol abuse with withdrawal, unspecified: Secondary | ICD-10-CM | POA: Diagnosis not present

## 2021-09-16 LAB — COMPREHENSIVE METABOLIC PANEL
ALT: 21 U/L (ref 0–44)
AST: 24 U/L (ref 15–41)
Albumin: 1.8 g/dL — ABNORMAL LOW (ref 3.5–5.0)
Alkaline Phosphatase: 131 U/L — ABNORMAL HIGH (ref 38–126)
Anion gap: 6 (ref 5–15)
BUN: 17 mg/dL (ref 6–20)
CO2: 25 mmol/L (ref 22–32)
Calcium: 8.3 mg/dL — ABNORMAL LOW (ref 8.9–10.3)
Chloride: 107 mmol/L (ref 98–111)
Creatinine, Ser: 0.73 mg/dL (ref 0.61–1.24)
GFR, Estimated: >60 mL/min (ref >60)
Glucose, Bld: 118 mg/dL — ABNORMAL HIGH (ref 70–99)
Potassium: 3.4 mmol/L — ABNORMAL LOW (ref 3.5–5.1)
Sodium: 138 mmol/L (ref 135–145)
Total Bilirubin: 0.6 mg/dL (ref 0.3–1.2)
Total Protein: 5.8 g/dL — ABNORMAL LOW (ref 6.5–8.1)

## 2021-09-16 LAB — CULTURE, RESPIRATORY W GRAM STAIN

## 2021-09-16 LAB — CBC
HCT: 30 % — ABNORMAL LOW (ref 39.0–52.0)
Hemoglobin: 9.4 g/dL — ABNORMAL LOW (ref 13.0–17.0)
MCH: 29.1 pg (ref 26.0–34.0)
MCHC: 31.3 g/dL (ref 30.0–36.0)
MCV: 92.9 fL (ref 80.0–100.0)
Platelets: 227 10*3/uL (ref 150–400)
RBC: 3.23 MIL/uL — ABNORMAL LOW (ref 4.22–5.81)
RDW: 16.3 % — ABNORMAL HIGH (ref 11.5–15.5)
WBC: 7.1 10*3/uL (ref 4.0–10.5)
nRBC: 0 % (ref 0.0–0.2)

## 2021-09-16 LAB — PHOSPHORUS: Phosphorus: 3.2 mg/dL (ref 2.5–4.6)

## 2021-09-16 LAB — MAGNESIUM: Magnesium: 1.6 mg/dL — ABNORMAL LOW (ref 1.7–2.4)

## 2021-09-16 NOTE — Progress Notes (Signed)
Pulmonary Critical Care Medicine [facility] [place]   PULMONARY CRITICAL CARE SERVICE  PROGRESS NOTE     Leon Davidson  GHW:[id number]  DOB: 1966/08/13   DOA: 09/07/2021  Referring Physician: Luna Kitchens, MD  HPI: Leon Davidson is a 55 y.o. male being followed for ventilator/airway/oxygen weaning Acute on Chronic Respiratory Failure.  Patient currently is on the ventilator and assist control mode has been weaned for a few days spoke with respiratory therapy to try again on pressure support  Medications: Reviewed on Rounds  Physical Exam:  Vitals: Temperature is 96.1 pulse 69 respiratory rate is 14 blood pressure is 133/71 saturations 94%  Ventilator Settings assist-control FiO2 40% tidal volume 500 PEEP 5  General: Comfortable at this time Neck: supple Cardiovascular: no malignant arrhythmias Respiratory: No rhonchi very coarse breath sounds Skin: no rash seen on limited exam Musculoskeletal: No gross abnormality Psychiatric:unable to assess Neurologic:no involuntary movements         Lab Data:   Basic Metabolic Panel: Recent Labs  Lab 09/12/21 0426 09/13/21 0339 09/14/21 0405 09/15/21 0104 09/16/21 0442  NA 147* 149* 143  --  138  K 3.8 3.8 3.1* 3.5 3.4*  CL 114* 113* 110  --  107  CO2 26 26 26   --  25  GLUCOSE 177* 198* 123*  --  118*  BUN 32* 29* 19  --  17  CREATININE 0.71 0.86 0.81  --  0.73  CALCIUM 9.2 9.1 8.5*  --  8.3*  MG 1.8 1.7 2.0  --  1.6*  PHOS  --  3.3 3.1  --  3.2    ABG: No results for input(s): PHART, PCO2ART, PO2ART, HCO3, O2SAT in the last 168 hours.  Liver Function Tests: Recent Labs  Lab 09/16/21 0442  AST 24  ALT 21  ALKPHOS 131*  BILITOT 0.6  PROT 5.8*  ALBUMIN 1.8*   No results for input(s): LIPASE, AMYLASE in the last 168 hours. Recent Labs  Lab 09/13/21 0339  AMMONIA 28    CBC: Recent Labs  Lab 09/12/21 0426 09/13/21 0339 09/14/21 0528 09/16/21 0442  WBC 9.4 10.6* 7.8 7.1  HGB 9.2* 8.9* 9.1*  9.4*  HCT 29.7* 28.9* 29.7* 30.0*  MCV 94.6 95.4 94.3 92.9  PLT 316 257 214 227    Cardiac Enzymes: No results for input(s): CKTOTAL, CKMB, CKMBINDEX, TROPONINI in the last 168 hours.  BNP (last 3 results) No results for input(s): BNP in the last 8760 hours.  ProBNP (last 3 results) No results for input(s): PROBNP in the last 8760 hours.  Radiological Exams: DG Abd Portable 1V  Result Date: 09/16/2021 CLINICAL DATA:  Ileus.  Ventilator EXAM: PORTABLE ABDOMEN - 1 VIEW COMPARISON:  Abdominal CT from 4 days ago FINDINGS: Dilated bowel loops mainly affecting small bowel but no transition point seen on prior CT and there is some colonic gas without as notable distension. No concerning mass effect or calcification. Increased right flank stripe related to ascites by CT. IMPRESSION: Unchanged gaseous distension of bowel, likely ileus. Electronically Signed   By: 09/18/2021 M.D.   On: 09/16/2021 06:15    Assessment/Plan Active Problems:   Acute on chronic respiratory failure with hypoxia (HCC)   Alcoholic cirrhosis of liver with ascites (HCC)   Acute metabolic encephalopathy   Aspiration pneumonia of both lower lobes due to gastric secretions (HCC)   Alcohol abuse with withdrawal (HCC)   Acute on chronic respiratory failure with hypoxia patient will be we attempted on weaning we  will try pressure support weaning once again today Alcoholic cirrhosis prognosis remains guarded overall Acute metabolic encephalopathy no change we will continue to follow along closely. Aspiration pneumonia patient remains at risk for recurrence of aspiration. Alcohol abuse no active withdrawal right now   I have personally seen and evaluated the patient, evaluated laboratory and imaging results, formulated the assessment and plan and placed orders. The Patient requires high complexity decision making with multiple systems involvement.  Rounds were done with the Respiratory Therapy Director and Staff  therapists and discussed with nursing staff also.  Yevonne Pax, MD [facility] Pulmonary Critical Care Medicine Sleep Medicine

## 2021-09-17 DIAGNOSIS — K7031 Alcoholic cirrhosis of liver with ascites: Secondary | ICD-10-CM | POA: Diagnosis not present

## 2021-09-17 DIAGNOSIS — G9341 Metabolic encephalopathy: Secondary | ICD-10-CM | POA: Diagnosis not present

## 2021-09-17 DIAGNOSIS — F10139 Alcohol abuse with withdrawal, unspecified: Secondary | ICD-10-CM | POA: Diagnosis not present

## 2021-09-17 DIAGNOSIS — J9621 Acute and chronic respiratory failure with hypoxia: Secondary | ICD-10-CM | POA: Diagnosis not present

## 2021-09-17 LAB — BASIC METABOLIC PANEL
Anion gap: 8 (ref 5–15)
BUN: 15 mg/dL (ref 6–20)
CO2: 21 mmol/L — ABNORMAL LOW (ref 22–32)
Calcium: 8.2 mg/dL — ABNORMAL LOW (ref 8.9–10.3)
Chloride: 106 mmol/L (ref 98–111)
Creatinine, Ser: 0.71 mg/dL (ref 0.61–1.24)
GFR, Estimated: >60 mL/min (ref >60)
Glucose, Bld: 95 mg/dL (ref 70–99)
Potassium: 3.5 mmol/L (ref 3.5–5.1)
Sodium: 135 mmol/L (ref 135–145)

## 2021-09-17 LAB — MAGNESIUM: Magnesium: 1.6 mg/dL — ABNORMAL LOW (ref 1.7–2.4)

## 2021-09-17 NOTE — Progress Notes (Signed)
Pulmonary Critical Care Medicine [facility] [place]   PULMONARY CRITICAL CARE SERVICE  PROGRESS NOTE     Nation Cradle  [id number]:[id number]  DOB: 27-Nov-1966   DOA: 09/07/2021  Referring Physician: Luna Kitchens, MD  HPI: Leon Davidson is a 55 y.o. male being followed for ventilator/airway/oxygen weaning Acute on Chronic Respiratory Failure.  Patient is comfortable right now without distress at this time has been on pressure support and weaning goal is for 4 hours  Medications: Reviewed on Rounds  Physical Exam:  Vitals: Temperature is 94.9 pulse 84 respiratory rate is 23 blood pressure 162/77 saturations 100%  Ventilator Settings pressure support FiO2 40% pressure 12/5  General: Comfortable at this time Neck: supple Cardiovascular: no malignant arrhythmias Respiratory: Scattered rhonchi expansion is equal Skin: no rash seen on limited exam Musculoskeletal: No gross abnormality Psychiatric:unable to assess Neurologic:no involuntary movements         Lab Data:   Basic Metabolic Panel: Recent Labs  Lab 09/12/21 0426 09/13/21 0339 09/14/21 0405 09/15/21 0104 09/16/21 0442 09/17/21 0605  NA 147* 149* 143  --  138 135  K 3.8 3.8 3.1* 3.5 3.4* 3.5  CL 114* 113* 110  --  107 106  CO2 26 26 26   --  25 21*  GLUCOSE 177* 198* 123*  --  118* 95  BUN 32* 29* 19  --  17 15  CREATININE 0.71 0.86 0.81  --  0.73 0.71  CALCIUM 9.2 9.1 8.5*  --  8.3* 8.2*  MG 1.8 1.7 2.0  --  1.6* 1.6*  PHOS  --  3.3 3.1  --  3.2  --     ABG: No results for input(s): PHART, PCO2ART, PO2ART, HCO3, O2SAT in the last 168 hours.  Liver Function Tests: Recent Labs  Lab 09/16/21 0442  AST 24  ALT 21  ALKPHOS 131*  BILITOT 0.6  PROT 5.8*  ALBUMIN 1.8*   No results for input(s): LIPASE, AMYLASE in the last 168 hours. Recent Labs  Lab 09/13/21 0339  AMMONIA 28    CBC: Recent Labs  Lab 09/12/21 0426 09/13/21 0339 09/14/21 0528 09/16/21 0442  WBC 9.4 10.6* 7.8 7.1  HGB  9.2* 8.9* 9.1* 9.4*  HCT 29.7* 28.9* 29.7* 30.0*  MCV 94.6 95.4 94.3 92.9  PLT 316 257 214 227    Cardiac Enzymes: No results for input(s): CKTOTAL, CKMB, CKMBINDEX, TROPONINI in the last 168 hours.  BNP (last 3 results) No results for input(s): BNP in the last 8760 hours.  ProBNP (last 3 results) No results for input(s): PROBNP in the last 8760 hours.  Radiological Exams: DG Abd Portable 1V  Result Date: 09/16/2021 CLINICAL DATA:  Ileus.  Ventilator EXAM: PORTABLE ABDOMEN - 1 VIEW COMPARISON:  Abdominal CT from 4 days ago FINDINGS: Dilated bowel loops mainly affecting small bowel but no transition point seen on prior CT and there is some colonic gas without as notable distension. No concerning mass effect or calcification. Increased right flank stripe related to ascites by CT. IMPRESSION: Unchanged gaseous distension of bowel, likely ileus. Electronically Signed   By: 09/18/2021 M.D.   On: 09/16/2021 06:15    Assessment/Plan Active Problems:   Acute on chronic respiratory failure with hypoxia (HCC)   Alcoholic cirrhosis of liver with ascites (HCC)   Acute metabolic encephalopathy   Aspiration pneumonia of both lower lobes due to gastric secretions (HCC)   Alcohol abuse with withdrawal (HCC)   Acute on chronic respiratory failure hypoxia patient currently is on  pressure support 12/5 goal of 4 hours continue to try to advance the weaning as tolerated. Alcohol cirrhosis of the liver no change we will continue with supportive care Metabolic encephalopathy again patient is at baseline Aspiration pneumonia has been treated we will continue to follow along closely Alcohol abuse at baseline   I have personally seen and evaluated the patient, evaluated laboratory and imaging results, formulated the assessment and plan and placed orders. The Patient requires high complexity decision making with multiple systems involvement.  Rounds were done with the Respiratory Therapy Director  and Staff therapists and discussed with nursing staff also.  Yevonne Pax, MD [facility] Pulmonary Critical Care Medicine Sleep Medicine

## 2021-09-18 ENCOUNTER — Other Ambulatory Visit (HOSPITAL_COMMUNITY): Payer: Medicaid Other

## 2021-09-18 LAB — CBC
HCT: 32.8 % — ABNORMAL LOW (ref 39.0–52.0)
Hemoglobin: 10.2 g/dL — ABNORMAL LOW (ref 13.0–17.0)
MCH: 28.5 pg (ref 26.0–34.0)
MCHC: 31.1 g/dL (ref 30.0–36.0)
MCV: 91.6 fL (ref 80.0–100.0)
Platelets: 199 10*3/uL (ref 150–400)
RBC: 3.58 MIL/uL — ABNORMAL LOW (ref 4.22–5.81)
RDW: 16.3 % — ABNORMAL HIGH (ref 11.5–15.5)
WBC: 6.3 10*3/uL (ref 4.0–10.5)
nRBC: 0 % (ref 0.0–0.2)

## 2021-09-18 LAB — BASIC METABOLIC PANEL
Anion gap: 5 (ref 5–15)
BUN: 12 mg/dL (ref 6–20)
CO2: 24 mmol/L (ref 22–32)
Calcium: 8.4 mg/dL — ABNORMAL LOW (ref 8.9–10.3)
Chloride: 108 mmol/L (ref 98–111)
Creatinine, Ser: 0.74 mg/dL (ref 0.61–1.24)
GFR, Estimated: >60 mL/min (ref >60)
Glucose, Bld: 72 mg/dL (ref 70–99)
Potassium: 3.5 mmol/L (ref 3.5–5.1)
Sodium: 137 mmol/L (ref 135–145)

## 2021-09-18 LAB — MAGNESIUM: Magnesium: 1.8 mg/dL (ref 1.7–2.4)

## 2021-09-18 LAB — PHOSPHORUS: Phosphorus: 2.6 mg/dL (ref 2.5–4.6)

## 2021-09-18 NOTE — Consult Note (Signed)
Infectious Disease Consultation   Leon Davidson  TRR:[id number]  DOB: 18-Nov-1966  DOA: 09/07/2021  Requesting physician: Dr. Sheryle Hail  Reason for consultation: Antibiotic Recommendations  History of Present Illness: Leon Davidson is an 55 y.o. male who was admitted to the acute facility on 07/28/2021 with alcohol withdrawal, multiple falls and delirium.  He has a past medical history significant of hypertension, hyperlipidemia, hypothyroidism, alcohol dependence.  He was noted to have elevated ammonia, worsening mental status with somnolence.  He also had episode of aspiration and acute hypoxemic respiratory failure which caused him to get intubated and transferred to the ICU.  He was started on propofol drip.  He later became agitated likely secondary to alcohol withdrawal and was started on Precedex.  He was extubated but had to be reintubated due to worsening hypoxemia.  He was intubated multiple times throughout his hospitalization.  Family wanted to continue aggressive care.  He also had PEA arrest with return of spontaneous circulation after CPR and epinephrine.  He was continued on pressors with sedation.  He underwent tracheostomy on 08/30/2021.  PEG tube was not placed due to ascites.  He had multiple electrolyte abnormalities which were replaced.  He underwent paracentesis on 09/03/2021 with removal of 3.5 L of fluid.  He was continued on ventilator support.  He was eventually weaned off pressors as well as sedation. Due to his complex medical problems he was transferred to [facility].  Review of Systems:  Unable to obtain due to encephalopathy.  Past Medical History: Congestive heart failure, coronary disease status post triple-vessel bypass, diabetes mellitus, GERD, hyperlipidemia, hypertension, calculus kidney.  Past Surgical History: History of coronary artery disease status post bypass graft, colonoscopy, EGD  Allergies: Atorvastatin  Social History: Alcohol  abuse with alcohol dependence, no history of smoking, no history of drug use per records.  Family History: Heart disease in father, hypertension, hyperlipidemia, thyroid disease, heart failure in mother, hypertension and right  Physical Exam: Vitals: Temperature 96, heart rate 63, respiratory rate 16, blood pressure 147/71, oxygen saturation 100%  Constitutional: Ill-appearing male, on vent Head: Atraumatic, normocephalic Eyes: PERLA, anicteric sclera,  ENMT: external ears and nose appear normal, normal hearing, Lips appears normal, oropharynx mucosa, tongue, posterior pharynx appear normal  Neck: no masses  CVS: S1-S2  Respiratory: Decreased breath sound lower lobes, rhonchi Abdomen: soft, nondistended, normal bowel sounds Musculoskeletal: Edema Neuro: He is on the vent, encephalopathic, unable to do a complete neurologic exam Psych: Encephalopathic Skin: no rashes  Data reviewed:  I have personally reviewed following labs and imaging studies Labs:  CBC: Recent Labs  Lab 09/12/21 0426 09/13/21 0339 09/14/21 0528 09/16/21 0442 09/18/21 0427  WBC 9.4 10.6* 7.8 7.1 6.3  HGB 9.2* 8.9* 9.1* 9.4* 10.2*  HCT 29.7* 28.9* 29.7* 30.0* 32.8*  MCV 94.6 95.4 94.3 92.9 91.6  PLT 316 257 214 227 199    Basic Metabolic Panel: Recent Labs  Lab 09/13/21 0339 09/14/21 0405 09/15/21 0104 09/16/21 0442 09/17/21 0605 09/18/21 0427  NA 149* 143  --  138 135 137  K 3.8 3.1*   < > 3.4* 3.5 3.5  CL 113* 110  --  107 106 108  CO2 26 26  --  25 21* 24  GLUCOSE 198* 123*  --  118* 95 72  BUN 29* 19  --  17 15 12   CREATININE 0.86 0.81  --  0.73 0.71 0.74  CALCIUM 9.1 8.5*  --  8.3* 8.2* 8.4*  MG 1.7 2.0  --  1.6* 1.6* 1.8  PHOS 3.3 3.1  --  3.2  --  2.6   < > = values in this interval not displayed.   GFR CrCl cannot be calculated (Unknown ideal weight.). Liver Function Tests: Recent Labs  Lab 09/16/21 0442  AST 24  ALT 21  ALKPHOS 131*  BILITOT 0.6  PROT 5.8*  ALBUMIN 1.8*    No results for input(s): LIPASE, AMYLASE in the last 168 hours. Recent Labs  Lab 09/13/21 0339  AMMONIA 28   Coagulation profile No results for input(s): INR, PROTIME in the last 168 hours.  Cardiac Enzymes: No results for input(s): CKTOTAL, CKMB, CKMBINDEX, TROPONINI in the last 168 hours. BNP: Invalid input(s): POCBNP CBG: No results for input(s): GLUCAP in the last 168 hours. D-Dimer No results for input(s): DDIMER in the last 72 hours. Hgb A1c No results for input(s): HGBA1C in the last 72 hours. Lipid Profile No results for input(s): CHOL, HDL, LDLCALC, TRIG, CHOLHDL, LDLDIRECT in the last 72 hours. Thyroid function studies No results for input(s): TSH, T4TOTAL, T3FREE, THYROIDAB in the last 72 hours.  Invalid input(s): FREET3 Anemia work up No results for input(s): VITAMINB12, FOLATE, FERRITIN, TIBC, IRON, RETICCTPCT in the last 72 hours. Urinalysis No results found for: COLORURINE, APPEARANCEUR, LABSPEC, PHURINE, GLUCOSEU, HGBUR, BILIRUBINUR, KETONESUR, PROTEINUR, UROBILINOGEN, NITRITE, LEUKOCYTESUR   Sepsis Labs Invalid input(s): PROCALCITONIN,  WBC,  LACTICIDVEN Microbiology Recent Results (from the past 240 hour(s))  Gram stain     Status: None   Collection Time: 09/09/21  4:43 PM   Specimen: Abdomen; Peritoneal Fluid  Result Value Ref Range Status   Specimen Description ABDOMEN  Final   Special Requests NONE  Final   Gram Stain   Final    WBC PRESENT,BOTH PMN AND MONONUCLEAR NO ORGANISMS SEEN CYTOSPIN SMEAR Performed at [facility], [address], [place], [place] [address]    Report Status 09/09/2021 FINAL  Final  Culture, body fluid w Gram Stain-bottle     Status: None   Collection Time: 09/09/21  4:43 PM   Specimen: Abdomen  Result Value Ref Range Status   Specimen Description ABDOMEN  Final   Special Requests NONE  Final   Culture   Final    NO GROWTH 5 DAYS Performed at [facility], [address], [place], [place] [address]     Report Status 09/14/2021 FINAL  Final  Culture, Respiratory w Gram Stain     Status: None   Collection Time: 09/12/21  4:16 PM   Specimen: Tracheal Aspirate; Respiratory  Result Value Ref Range Status   Specimen Description TRACHEAL ASPIRATE  Final   Special Requests NONE  Final   Gram Stain   Final    NO ORGANISMS SEEN SQUAMOUS EPITHELIAL CELLS PRESENT FEW WBC PRESENT, PREDOMINANTLY MONONUCLEAR RARE GRAM NEGATIVE RODS Performed at [facility], [address] [place], [place] [address]    Culture RARE PANTOEA SPECIES RARE CANDIDA DUBLINIENSIS   Final   Report Status 09/16/2021 FINAL  Final   Organism ID, Bacteria PANTOEA SPECIES  Final      Susceptibility   Pantoea species - MIC*    CEFAZOLIN >=64 RESISTANT Resistant     CEFEPIME 2 SENSITIVE Sensitive     CEFTAZIDIME 16 INTERMEDIATE Intermediate     CEFTRIAXONE 32 RESISTANT Resistant     CIPROFLOXACIN >=4 RESISTANT Resistant     GENTAMICIN <=1 SENSITIVE Sensitive  IMIPENEM >=16 RESISTANT Resistant     TRIMETH/SULFA 40 SENSITIVE Sensitive     PIP/TAZO >=128 RESISTANT Resistant     * RARE PANTOEA SPECIES    Inpatient Medications:   Please see MAR  Radiological Exams on Admission: DG Abd Portable 1V  Result Date: 09/18/2021 CLINICAL DATA:  Follow-up ileus EXAM: PORTABLE ABDOMEN - 1 VIEW COMPARISON:  Two days ago FINDINGS: Mild improvement in diffuse small bowel gaseous distension. No evidence of pneumatosis or abnormal gas collection. Feeding tube tip overlaps the left upper quadrant. IMPRESSION: Mild improvement in small bowel distension. Electronically Signed   By: Tiburcio Pea M.D.   On: 09/18/2021 07:29    Impression/Recommendations Active Problems:   Acute on chronic respiratory failure with hypoxia  Ventilator dependent respiratory failure Aspiration pneumonia of both lower lobes due to gastric secretions  Chronic congestive heart failure   Alcoholic cirrhosis of liver with ascites  Diabetes  mellitus type 2   Acute metabolic encephalopathy     Alcohol abuse with withdrawal  Dysphagia/protein calorie malnutrition  Acute on chronic respiratory failure with hypoxemia: Likely secondary to aspiration pneumonia.  Remains ventilator dependent.  He was extubated at the acute facility but had to be intubated multiple times likely secondary to ongoing aspiration.  He had respiratory cultures collected on 09/12/2021 which showed Pantoea species.  On treatment with cefepime.  Recommend to treat for duration of 10 days pending improvement.  If is not improving suggest to add Flagyl for anaerobic coverage.  Please monitor CBC, BUN/cr closely while on antibiotics.  He also had rare Candida Dubliniensis which likely is oral contamination secondary to aspiration.  Pulmonary following.  If his respiratory status does not improve suggest to send for repeat respiratory cultures and repeat chest imaging preferably chest CT which can be done without contrast if concern for renal compromise.  Unfortunately due to his dysphagia he is high risk for recurrent aspiration and aspiration pneumonia despite being on antibiotics.  Alcoholic cirrhosis with ascites/esophageal varices: He is status post paracentesis at the acute facility and again here.  He completed treatment with ceftriaxone for SBP prophylaxis.  Fluid cultures negative.  Currently on cefepime for pneumonia.  Further management per primary team.  Chronic congestive heart failure: Continue medication and management per the primary team.  Diabetes mellitus type 2: Continue medications and management per the primary team.  Acute metabolic encephalopathy: Continue supportive management by the primary team.  Alcohol abuse with withdrawal/delirium: He received Precedex at the acute facility.  Continue treatment with MVI, folate, thiamine.  Further management per primary team.  Dysphagia/protein calorie malnutrition: Due to his dysphagia he is high risk for  ongoing aspiration and recurrent aspiration pneumonia despite being on antibiotics.  Management of protein calorie management per primary team.  Plan of care discussed with the primary team and pharmacy.  Thank you for this consultation.    Vonzella Nipple M.D. 09/18/2021, 7:06 PM

## 2021-09-19 ENCOUNTER — Other Ambulatory Visit (HOSPITAL_COMMUNITY): Payer: Medicaid Other

## 2021-09-19 DIAGNOSIS — K7031 Alcoholic cirrhosis of liver with ascites: Secondary | ICD-10-CM | POA: Diagnosis not present

## 2021-09-19 DIAGNOSIS — F10139 Alcohol abuse with withdrawal, unspecified: Secondary | ICD-10-CM | POA: Diagnosis not present

## 2021-09-19 DIAGNOSIS — G9341 Metabolic encephalopathy: Secondary | ICD-10-CM | POA: Diagnosis not present

## 2021-09-19 DIAGNOSIS — J9621 Acute and chronic respiratory failure with hypoxia: Secondary | ICD-10-CM | POA: Diagnosis not present

## 2021-09-19 LAB — PHOSPHORUS: Phosphorus: 2.5 mg/dL (ref 2.5–4.6)

## 2021-09-19 LAB — MAGNESIUM: Magnesium: 1.6 mg/dL — ABNORMAL LOW (ref 1.7–2.4)

## 2021-09-19 LAB — COMPREHENSIVE METABOLIC PANEL
ALT: 20 U/L (ref 0–44)
AST: 29 U/L (ref 15–41)
Albumin: 1.8 g/dL — ABNORMAL LOW (ref 3.5–5.0)
Alkaline Phosphatase: 126 U/L (ref 38–126)
Anion gap: 5 (ref 5–15)
BUN: 14 mg/dL (ref 6–20)
CO2: 23 mmol/L (ref 22–32)
Calcium: 8.7 mg/dL — ABNORMAL LOW (ref 8.9–10.3)
Chloride: 108 mmol/L (ref 98–111)
Creatinine, Ser: 0.66 mg/dL (ref 0.61–1.24)
GFR, Estimated: >60 mL/min (ref >60)
Glucose, Bld: 80 mg/dL (ref 70–99)
Potassium: 4 mmol/L (ref 3.5–5.1)
Sodium: 136 mmol/L (ref 135–145)
Total Bilirubin: 0.6 mg/dL (ref 0.3–1.2)
Total Protein: 6.3 g/dL — ABNORMAL LOW (ref 6.5–8.1)

## 2021-09-19 NOTE — Progress Notes (Signed)
Pulmonary Critical Care Medicine [facility] [place]   PULMONARY CRITICAL CARE SERVICE  PROGRESS NOTE     Leon Davidson  NLZ:[id number]  DOB: 1966-03-19   DOA: 09/07/2021  Referring Physician: Luna Kitchens, MD  HPI: Leon Davidson is a 55 y.o. male being followed for ventilator/airway/oxygen weaning Acute on Chronic Respiratory Failure.  Patient is pressure support mode right now on 35% FiO2.  Medications: Reviewed on Rounds  Physical Exam:  Vitals: Temperature is 96.4 pulse 69 respiratory rate 14 blood pressure is 120/80 saturations 100%  Ventilator Settings pressure support FiO2 35% pressure 10/5  General: Comfortable at this time Neck: supple Cardiovascular: no malignant arrhythmias Respiratory: No rhonchi very coarse breath sounds Skin: no rash seen on limited exam Musculoskeletal: No gross abnormality Psychiatric:unable to assess Neurologic:no involuntary movements         Lab Data:   Basic Metabolic Panel: Recent Labs  Lab 09/13/21 0339 09/14/21 0405 09/15/21 0104 09/16/21 0442 09/17/21 0605 09/18/21 0427 09/19/21 0514  NA 149* 143  --  138 135 137 136  K 3.8 3.1* 3.5 3.4* 3.5 3.5 4.0  CL 113* 110  --  107 106 108 108  CO2 26 26  --  25 21* 24 23  GLUCOSE 198* 123*  --  118* 95 72 80  BUN 29* 19  --  17 15 12 14   CREATININE 0.86 0.81  --  0.73 0.71 0.74 0.66  CALCIUM 9.1 8.5*  --  8.3* 8.2* 8.4* 8.7*  MG 1.7 2.0  --  1.6* 1.6* 1.8 1.6*  PHOS 3.3 3.1  --  3.2  --  2.6 2.5    ABG: No results for input(s): PHART, PCO2ART, PO2ART, HCO3, O2SAT in the last 168 hours.  Liver Function Tests: Recent Labs  Lab 09/16/21 0442 09/19/21 0514  AST 24 29  ALT 21 20  ALKPHOS 131* 126  BILITOT 0.6 0.6  PROT 5.8* 6.3*  ALBUMIN 1.8* 1.8*   No results for input(s): LIPASE, AMYLASE in the last 168 hours. Recent Labs  Lab 09/13/21 0339  AMMONIA 28    CBC: Recent Labs  Lab 09/13/21 0339 09/14/21 0528 09/16/21 0442 09/18/21 0427  WBC  10.6* 7.8 7.1 6.3  HGB 8.9* 9.1* 9.4* 10.2*  HCT 28.9* 29.7* 30.0* 32.8*  MCV 95.4 94.3 92.9 91.6  PLT 257 214 227 199    Cardiac Enzymes: No results for input(s): CKTOTAL, CKMB, CKMBINDEX, TROPONINI in the last 168 hours.  BNP (last 3 results) No results for input(s): BNP in the last 8760 hours.  ProBNP (last 3 results) No results for input(s): PROBNP in the last 8760 hours.  Radiological Exams: DG Abd Portable 1V  Result Date: 09/19/2021 CLINICAL DATA:  Ileus EXAM: PORTABLE ABDOMEN - 1 VIEW COMPARISON:  the previous day's study FINDINGS: Multiple gas distended small bowel loops throughout the visualized abdomen, slightly increased in degree of distension. The upper abdomen is excluded. The colon appears relatively decompressed as before. Regional bones unremarkable. Aortoiliac calcified plaque. IMPRESSION: Slight progression of small bowel distension. Electronically Signed   By: 09/21/2021 M.D.   On: 09/19/2021 05:45   DG Abd Portable 1V  Result Date: 09/18/2021 CLINICAL DATA:  Follow-up ileus EXAM: PORTABLE ABDOMEN - 1 VIEW COMPARISON:  Two days ago FINDINGS: Mild improvement in diffuse small bowel gaseous distension. No evidence of pneumatosis or abnormal gas collection. Feeding tube tip overlaps the left upper quadrant. IMPRESSION: Mild improvement in small bowel distension. Electronically Signed   By: 09/20/2021  M.D.   On: 09/18/2021 07:29    Assessment/Plan Active Problems:   Acute on chronic respiratory failure with hypoxia (HCC)   Alcoholic cirrhosis of liver with ascites (HCC)   Acute metabolic encephalopathy   Aspiration pneumonia of both lower lobes due to gastric secretions (HCC)   Alcohol abuse with withdrawal (HCC)   Acute on chronic respiratory failure with hypoxia patient is on pressure support wean on 35% FiO2 the goal is for 12 hours Alcohol cirrhosis no change we will continue with supportive care Metabolic encephalopathy patient appears to be at  baseline Aspiration pneumonia has been treated with antibiotics Alcohol abuse no active withdrawal right now   I have personally seen and evaluated the patient, evaluated laboratory and imaging results, formulated the assessment and plan and placed orders. The Patient requires high complexity decision making with multiple systems involvement.  Rounds were done with the Respiratory Therapy Director and Staff therapists and discussed with nursing staff also.  Yevonne Pax, MD [facility] Pulmonary Critical Care Medicine Sleep Medicine

## 2021-09-20 ENCOUNTER — Other Ambulatory Visit (HOSPITAL_COMMUNITY): Payer: Medicaid Other

## 2021-09-20 DIAGNOSIS — F10139 Alcohol abuse with withdrawal, unspecified: Secondary | ICD-10-CM | POA: Diagnosis not present

## 2021-09-20 DIAGNOSIS — K7031 Alcoholic cirrhosis of liver with ascites: Secondary | ICD-10-CM | POA: Diagnosis not present

## 2021-09-20 DIAGNOSIS — J9621 Acute and chronic respiratory failure with hypoxia: Secondary | ICD-10-CM | POA: Diagnosis not present

## 2021-09-20 DIAGNOSIS — G9341 Metabolic encephalopathy: Secondary | ICD-10-CM | POA: Diagnosis not present

## 2021-09-20 LAB — POTASSIUM: Potassium: 3.9 mmol/L (ref 3.5–5.1)

## 2021-09-20 LAB — MAGNESIUM: Magnesium: 1.7 mg/dL (ref 1.7–2.4)

## 2021-09-20 NOTE — Progress Notes (Signed)
Pulmonary Critical Care Medicine [facility] [place]   PULMONARY CRITICAL CARE SERVICE  PROGRESS NOTE     Leon Davidson  HGD:[id number]  DOB: [birth year]   DOA: 09/07/2021  Referring Physician: Luna Kitchens, MD  HPI: Leon Davidson is a 55 y.o. male being followed for ventilator/airway/oxygen weaning Acute on Chronic Respiratory Failure.  Patient did 12 hours of pressure support yesterday supposed to do 16 hours today  Medications: Reviewed on Rounds  Physical Exam:  Vitals: Temperature is 97.3 pulse 74 respiratory rate is 26 blood pressure is 115/69 saturations 98%  Ventilator Settings currently on assist control FiO2 30% tidal volume 500 PEEP 5  General: Comfortable at this time Neck: supple Cardiovascular: no malignant arrhythmias Respiratory: Scattered rhonchi expansion is equal Skin: no rash seen on limited exam Musculoskeletal: No gross abnormality Psychiatric:unable to assess Neurologic:no involuntary movements         Lab Data:   Basic Metabolic Panel: Recent Labs  Lab 09/14/21 0405 09/15/21 0104 09/16/21 0442 09/17/21 0605 09/18/21 0427 09/19/21 0514 09/20/21 0433  NA 143  --  138 135 137 136  --   K 3.1*   < > 3.4* 3.5 3.5 4.0 3.9  CL 110  --  107 106 108 108  --   CO2 26  --  25 21* 24 23  --   GLUCOSE 123*  --  118* 95 72 80  --   BUN 19  --  17 15 12 14   --   CREATININE 0.81  --  0.73 0.71 0.74 0.66  --   CALCIUM 8.5*  --  8.3* 8.2* 8.4* 8.7*  --   MG 2.0  --  1.6* 1.6* 1.8 1.6* 1.7  PHOS 3.1  --  3.2  --  2.6 2.5  --    < > = values in this interval not displayed.    ABG: No results for input(s): PHART, PCO2ART, PO2ART, HCO3, O2SAT in the last 168 hours.  Liver Function Tests: Recent Labs  Lab 09/16/21 0442 09/19/21 0514  AST 24 29  ALT 21 20  ALKPHOS 131* 126  BILITOT 0.6 0.6  PROT 5.8* 6.3*  ALBUMIN 1.8* 1.8*   No results for input(s): LIPASE, AMYLASE in the last 168 hours. No results for input(s): AMMONIA in the  last 168 hours.  CBC: Recent Labs  Lab 09/14/21 0528 09/16/21 0442 09/18/21 0427  WBC 7.8 7.1 6.3  HGB 9.1* 9.4* 10.2*  HCT 29.7* 30.0* 32.8*  MCV 94.3 92.9 91.6  PLT 214 227 199    Cardiac Enzymes: No results for input(s): CKTOTAL, CKMB, CKMBINDEX, TROPONINI in the last 168 hours.  BNP (last 3 results) No results for input(s): BNP in the last 8760 hours.  ProBNP (last 3 results) No results for input(s): PROBNP in the last 8760 hours.  Radiological Exams: DG Abd Portable 1V  Result Date: 09/20/2021 CLINICAL DATA:  55 year old male with recent small bowel obstruction or ileus. Cirrhosis. EXAM: PORTABLE ABDOMEN - 1 VIEW COMPARISON:  09/19/2021 and earlier including CT Abdomen and Pelvis 09/12/2021. FINDINGS: Portable AP supine view at 0536 hours. Gray hazy opacity in the abdomen compatible with ascites. Nonobstructed bowel-gas pattern. Decreased small bowel gas and distension since yesterday. Enteric feeding tube tip in the epigastrium, at or near the GE junction. No acute osseous abnormality identified. IMPRESSION: 1. Enteric feeding tube tip is at the GEJ, has been pulled back since the CT on 09/12/2021. 2. Improved bowel gas pattern since yesterday, approaching normal now. 3. Ascites.  Electronically Signed   By: Odessa Fleming M.D.   On: 09/20/2021 06:57   DG Abd Portable 1V  Result Date: 09/19/2021 CLINICAL DATA:  Ileus EXAM: PORTABLE ABDOMEN - 1 VIEW COMPARISON:  the previous day's study FINDINGS: Multiple gas distended small bowel loops throughout the visualized abdomen, slightly increased in degree of distension. The upper abdomen is excluded. The colon appears relatively decompressed as before. Regional bones unremarkable. Aortoiliac calcified plaque. IMPRESSION: Slight progression of small bowel distension. Electronically Signed   By: Corlis Leak M.D.   On: 09/19/2021 05:45    Assessment/Plan Active Problems:   Acute on chronic respiratory failure with hypoxia (HCC)   Alcoholic  cirrhosis of liver with ascites (HCC)   Acute metabolic encephalopathy   Aspiration pneumonia of both lower lobes due to gastric secretions (HCC)   Alcohol abuse with withdrawal (HCC)   Acute on chronic respiratory failure with hypoxia patient is going to wean 16 hours today Alcohol cirrhosis no change we will continue with supportive care Metabolic encephalopathy really no improvement remains at baseline Aspiration pneumonia has been treated Alcohol abuse no sign of active withdrawal   I have personally seen and evaluated the patient, evaluated laboratory and imaging results, formulated the assessment and plan and placed orders. The Patient requires high complexity decision making with multiple systems involvement.  Rounds were done with the Respiratory Therapy Director and Staff therapists and discussed with nursing staff also.  Leon Pax, MD [facility] Pulmonary Critical Care Medicine Sleep Medicine

## 2021-09-21 DIAGNOSIS — F10139 Alcohol abuse with withdrawal, unspecified: Secondary | ICD-10-CM | POA: Diagnosis not present

## 2021-09-21 DIAGNOSIS — G9341 Metabolic encephalopathy: Secondary | ICD-10-CM | POA: Diagnosis not present

## 2021-09-21 DIAGNOSIS — K7031 Alcoholic cirrhosis of liver with ascites: Secondary | ICD-10-CM | POA: Diagnosis not present

## 2021-09-21 DIAGNOSIS — J9621 Acute and chronic respiratory failure with hypoxia: Secondary | ICD-10-CM | POA: Diagnosis not present

## 2021-09-21 NOTE — Progress Notes (Signed)
PROGRESS NOTE    Leon Davidson  URK:[id number] DOB: [birth year] DOA: 09/07/2021  Brief Narrative:  Leon Davidson is an 55 y.o. male who was admitted to the acute facility on 07/28/2021 with alcohol withdrawal, multiple falls and delirium.  He has a past medical history significant of hypertension, hyperlipidemia, hypothyroidism, alcohol dependence.  He was noted to have elevated ammonia, worsening mental status with somnolence.  He also had episode of aspiration and acute hypoxemic respiratory failure which caused him to get intubated and transferred to the ICU.  He was started on propofol drip.  He later became agitated likely secondary to alcohol withdrawal and was started on Precedex.  He was extubated but had to be reintubated due to worsening hypoxemia.  He was intubated multiple times throughout his hospitalization.  Family wanted to continue aggressive care.  He also had PEA arrest with return of spontaneous circulation after CPR and epinephrine.  He was continued on pressors with sedation.  He underwent tracheostomy on 08/30/2021.  PEG tube was not placed due to ascites.  He had multiple electrolyte abnormalities which were replaced.  He underwent paracentesis on 09/03/2021 with removal of 3.5 L of fluid.  He was continued on ventilator support.  He was eventually weaned off pressors as well as sedation. Due to his complex medical problems he was transferred to [facility]. He was found to have pneumonia with gram-negative rods.   Assessment & Plan:  Acute on chronic respiratory failure with hypoxia  Ventilator dependent respiratory failure Aspiration pneumonia of both lower lobes due to gastric secretions  Chronic congestive heart failure Alcoholic cirrhosis of liver with ascites  Diabetes mellitus type 2 Acute metabolic encephalopathy   Alcohol abuse with withdrawal  Dysphagia/protein calorie malnutrition   Acute on chronic respiratory failure with hypoxemia: Likely secondary to  aspiration pneumonia. He had respiratory cultures collected on 09/12/2021 which showed Pantoea species.  On treatment with cefepime. Continue to treat for duration of 10 days pending improvement.  If is not improving suggest to add Flagyl for anaerobic coverage.  Please monitor CBC, BUN/cr closely while on antibiotics.  He also had rare Candida Dubliniensis which likely is oral contamination secondary to aspiration.  Pulmonary following.  If his respiratory status does not improve suggest to send for repeat respiratory cultures and repeat chest imaging preferably chest CT which can be done without contrast if concern for renal compromise.  Unfortunately due to his dysphagia he is high risk for recurrent aspiration and aspiration pneumonia despite being on antibiotics.   Alcoholic cirrhosis with ascites/esophageal varices: He is status post paracentesis at the acute facility and again here.  He completed treatment with ceftriaxone for SBP prophylaxis.  Fluid cultures negative.  Currently on cefepime for pneumonia.  Further management per primary team.   Chronic congestive heart failure: Continue medication and management per the primary team.   Diabetes mellitus type 2: Continue medications and management per the primary team.   Acute metabolic encephalopathy: Continue supportive management by the primary team.   Alcohol abuse with withdrawal/delirium: He received Precedex at the acute facility.  Continue treatment with MVI, folate, thiamine.  Further management per primary team.   Dysphagia/protein calorie malnutrition: Due to his dysphagia he is high risk for ongoing aspiration and recurrent aspiration pneumonia despite being on antibiotics.  Management of protein calorie management per primary team.   Plan of care discussed with the primary team and pharmacy.   Subjective: He is on 35% FiO2.  Encephalopathy.  Objective: Vitals: Temperature  97.8, pulse 69, respiratory rate 18, blood pressure  103/94, oxygen saturation 100%  Examination: Constitutional: Ill-appearing male Head: Atraumatic, normocephalic Eyes: PERLA, anicteric sclera,  ENMT: external ears and nose appear normal, normal hearing, Lips appears normal, oropharynx mucosa, tongue, posterior pharynx appear normal  Neck: no masses  CVS: S1-S2  Respiratory: Coarse breath sounds with scattered rhonchi Abdomen: soft, nondistended, normal bowel sounds Musculoskeletal: Edema Neuro: encephalopathic, unable to do a complete neurologic exam Psych: Encephalopathic, unable to assess at this time Skin: no rashes     Data Reviewed: I have personally reviewed following labs and imaging studies  CBC: Recent Labs  Lab 09/16/21 0442 09/18/21 0427  WBC 7.1 6.3  HGB 9.4* 10.2*  HCT 30.0* 32.8*  MCV 92.9 91.6  PLT 227 199    Basic Metabolic Panel: Recent Labs  Lab 09/16/21 0442 09/17/21 0605 09/18/21 0427 09/19/21 0514 09/20/21 0433  NA 138 135 137 136  --   K 3.4* 3.5 3.5 4.0 3.9  CL 107 106 108 108  --   CO2 25 21* 24 23  --   GLUCOSE 118* 95 72 80  --   BUN 17 15 12 14   --   CREATININE 0.73 0.71 0.74 0.66  --   CALCIUM 8.3* 8.2* 8.4* 8.7*  --   MG 1.6* 1.6* 1.8 1.6* 1.7  PHOS 3.2  --  2.6 2.5  --     GFR: CrCl cannot be calculated (Unknown ideal weight.).  Liver Function Tests: Recent Labs  Lab 09/16/21 0442 09/19/21 0514  AST 24 29  ALT 21 20  ALKPHOS 131* 126  BILITOT 0.6 0.6  PROT 5.8* 6.3*  ALBUMIN 1.8* 1.8*    CBG: No results for input(s): GLUCAP in the last 168 hours.   Recent Results (from the past 240 hour(s))  Culture, Respiratory w Gram Stain     Status: None   Collection Time: 09/12/21  4:16 PM   Specimen: Tracheal Aspirate; Respiratory  Result Value Ref Range Status   Specimen Description TRACHEAL ASPIRATE  Final   Special Requests NONE  Final   Gram Stain   Final    NO ORGANISMS SEEN SQUAMOUS EPITHELIAL CELLS PRESENT FEW WBC PRESENT, PREDOMINANTLY MONONUCLEAR RARE GRAM  NEGATIVE RODS Performed at [facility], [address] [place], [place]    Culture RARE PANTOEA SPECIES RARE CANDIDA DUBLINIENSIS   Final   Report Status 09/16/2021 FINAL  Final   Organism ID, Bacteria PANTOEA SPECIES  Final      Susceptibility   Pantoea species - MIC*    CEFAZOLIN >=64 RESISTANT Resistant     CEFEPIME 2 SENSITIVE Sensitive     CEFTAZIDIME 16 INTERMEDIATE Intermediate     CEFTRIAXONE 32 RESISTANT Resistant     CIPROFLOXACIN >=4 RESISTANT Resistant     GENTAMICIN <=1 SENSITIVE Sensitive     IMIPENEM >=16 RESISTANT Resistant     TRIMETH/SULFA 40 SENSITIVE Sensitive     PIP/TAZO >=128 RESISTANT Resistant     * RARE PANTOEA SPECIES     Radiology Studies: DG Abd 1 View  Result Date: 09/20/2021 CLINICAL DATA:  NG tube placement EXAM: ABDOMEN - 1 VIEW COMPARISON:  09/20/2021 FINDINGS: Esophageal tube tip overlies the stomach. Upper gas pattern is unremarkable. IMPRESSION: Esophageal tube tip overlies the stomach Electronically Signed   By: 09/22/2021 M.D.   On: 09/20/2021 19:02   DG Abd Portable 1V  Result Date: 09/20/2021 CLINICAL DATA:  55 year old male with recent small bowel obstruction  or ileus. Cirrhosis. EXAM: PORTABLE ABDOMEN - 1 VIEW COMPARISON:  09/19/2021 and earlier including CT Abdomen and Pelvis 09/12/2021. FINDINGS: Portable AP supine view at 0536 hours. Gray hazy opacity in the abdomen compatible with ascites. Nonobstructed bowel-gas pattern. Decreased small bowel gas and distension since yesterday. Enteric feeding tube tip in the epigastrium, at or near the GE junction. No acute osseous abnormality identified. IMPRESSION: 1. Enteric feeding tube tip is at the GEJ, has been pulled back since the CT on 09/12/2021. 2. Improved bowel gas pattern since yesterday, approaching normal now. 3. Ascites. Electronically Signed   By: Odessa Fleming M.D.   On: 09/20/2021 06:57    Scheduled Meds: Please see MAR  Vonzella Nipple, MD  09/21/2021, 4:11 PM

## 2021-09-21 NOTE — Progress Notes (Signed)
Pulmonary Critical Care Medicine [facility] [place]   PULMONARY CRITICAL CARE SERVICE  PROGRESS NOTE     Leon Davidson  LKT:[id number]  DOB: Jan 17, 1966   DOA: 09/07/2021  Referring Physician: Luna Kitchens, MD  HPI: Leon Davidson is a 55 y.o. male being followed for ventilator/airway/oxygen weaning Acute on Chronic Respiratory Failure.  Patient is afebrile right now resting comfortably without distress has been on pressure support  Medications: Reviewed on Rounds  Physical Exam:  Vitals: Temperature is 97.5 pulse 69 respiratory is 18 blood pressure is 103/94 saturations 100%  Ventilator Settings on pressure support FiO2 35% pressure 12/5 tidal volume 450  General: Comfortable at this time Neck: supple Cardiovascular: no malignant arrhythmias Respiratory: Coarse rhonchi are noted bilaterally Skin: no rash seen on limited exam Musculoskeletal: No gross abnormality Psychiatric:unable to assess Neurologic:no involuntary movements         Lab Data:   Basic Metabolic Panel: Recent Labs  Lab 09/16/21 0442 09/17/21 0605 09/18/21 0427 09/19/21 0514 09/20/21 0433  NA 138 135 137 136  --   K 3.4* 3.5 3.5 4.0 3.9  CL 107 106 108 108  --   CO2 25 21* 24 23  --   GLUCOSE 118* 95 72 80  --   BUN 17 15 12 14   --   CREATININE 0.73 0.71 0.74 0.66  --   CALCIUM 8.3* 8.2* 8.4* 8.7*  --   MG 1.6* 1.6* 1.8 1.6* 1.7  PHOS 3.2  --  2.6 2.5  --     ABG: No results for input(s): PHART, PCO2ART, PO2ART, HCO3, O2SAT in the last 168 hours.  Liver Function Tests: Recent Labs  Lab 09/16/21 0442 09/19/21 0514  AST 24 29  ALT 21 20  ALKPHOS 131* 126  BILITOT 0.6 0.6  PROT 5.8* 6.3*  ALBUMIN 1.8* 1.8*   No results for input(s): LIPASE, AMYLASE in the last 168 hours. No results for input(s): AMMONIA in the last 168 hours.  CBC: Recent Labs  Lab 09/16/21 0442 09/18/21 0427  WBC 7.1 6.3  HGB 9.4* 10.2*  HCT 30.0* 32.8*  MCV 92.9 91.6  PLT 227 199     Cardiac Enzymes: No results for input(s): CKTOTAL, CKMB, CKMBINDEX, TROPONINI in the last 168 hours.  BNP (last 3 results) No results for input(s): BNP in the last 8760 hours.  ProBNP (last 3 results) No results for input(s): PROBNP in the last 8760 hours.  Radiological Exams: DG Abd 1 View  Result Date: 09/20/2021 CLINICAL DATA:  NG tube placement EXAM: ABDOMEN - 1 VIEW COMPARISON:  09/20/2021 FINDINGS: Esophageal tube tip overlies the stomach. Upper gas pattern is unremarkable. IMPRESSION: Esophageal tube tip overlies the stomach Electronically Signed   By: 09/22/2021 M.D.   On: 09/20/2021 19:02   DG Abd Portable 1V  Result Date: 09/20/2021 CLINICAL DATA:  55 year old male with recent small bowel obstruction or ileus. Cirrhosis. EXAM: PORTABLE ABDOMEN - 1 VIEW COMPARISON:  09/19/2021 and earlier including CT Abdomen and Pelvis 09/12/2021. FINDINGS: Portable AP supine view at 0536 hours. Gray hazy opacity in the abdomen compatible with ascites. Nonobstructed bowel-gas pattern. Decreased small bowel gas and distension since yesterday. Enteric feeding tube tip in the epigastrium, at or near the GE junction. No acute osseous abnormality identified. IMPRESSION: 1. Enteric feeding tube tip is at the GEJ, has been pulled back since the CT on 09/12/2021. 2. Improved bowel gas pattern since yesterday, approaching normal now. 3. Ascites. Electronically Signed   By: 11/12/2021  M.D.   On: 09/20/2021 06:57    Assessment/Plan Active Problems:   Acute on chronic respiratory failure with hypoxia (HCC)   Alcoholic cirrhosis of liver with ascites (HCC)   Acute metabolic encephalopathy   Aspiration pneumonia of both lower lobes due to gastric secretions (HCC)   Alcohol abuse with withdrawal (HCC)   Acute on chronic respiratory failure with hypoxia patient is doing okay with pressure support I spoke with respiratory therapy about doing trial of T collar may be starting at 2 hours and then trying to  advance as tolerated. Alcoholic cirrhosis no change we will continue with supportive care. Metabolic encephalopathy patient is at baseline Aspiration pneumonia has been treated slow improvement Alcohol withdrawal no sign of active withdrawal right now   I have personally seen and evaluated the patient, evaluated laboratory and imaging results, formulated the assessment and plan and placed orders. The Patient requires high complexity decision making with multiple systems involvement.  Rounds were done with the Respiratory Therapy Director and Staff therapists and discussed with nursing staff also.  Yevonne Pax, MD [facility] Pulmonary Critical Care Medicine Sleep Medicine

## 2021-09-22 DIAGNOSIS — K7031 Alcoholic cirrhosis of liver with ascites: Secondary | ICD-10-CM | POA: Diagnosis not present

## 2021-09-22 DIAGNOSIS — F10139 Alcohol abuse with withdrawal, unspecified: Secondary | ICD-10-CM | POA: Diagnosis not present

## 2021-09-22 DIAGNOSIS — J9621 Acute and chronic respiratory failure with hypoxia: Secondary | ICD-10-CM | POA: Diagnosis not present

## 2021-09-22 DIAGNOSIS — G9341 Metabolic encephalopathy: Secondary | ICD-10-CM | POA: Diagnosis not present

## 2021-09-22 LAB — CBC
HCT: 28.2 % — ABNORMAL LOW (ref 39.0–52.0)
Hemoglobin: 8.8 g/dL — ABNORMAL LOW (ref 13.0–17.0)
MCH: 28.7 pg (ref 26.0–34.0)
MCHC: 31.2 g/dL (ref 30.0–36.0)
MCV: 91.9 fL (ref 80.0–100.0)
Platelets: 109 10*3/uL — ABNORMAL LOW (ref 150–400)
RBC: 3.07 MIL/uL — ABNORMAL LOW (ref 4.22–5.81)
RDW: 16.5 % — ABNORMAL HIGH (ref 11.5–15.5)
WBC: 5.4 10*3/uL (ref 4.0–10.5)
nRBC: 0 % (ref 0.0–0.2)

## 2021-09-22 LAB — BASIC METABOLIC PANEL
Anion gap: 7 (ref 5–15)
BUN: 29 mg/dL — ABNORMAL HIGH (ref 6–20)
CO2: 22 mmol/L (ref 22–32)
Calcium: 8.7 mg/dL — ABNORMAL LOW (ref 8.9–10.3)
Chloride: 107 mmol/L (ref 98–111)
Creatinine, Ser: 0.79 mg/dL (ref 0.61–1.24)
GFR, Estimated: >60 mL/min (ref >60)
Glucose, Bld: 130 mg/dL — ABNORMAL HIGH (ref 70–99)
Potassium: 3.9 mmol/L (ref 3.5–5.1)
Sodium: 136 mmol/L (ref 135–145)

## 2021-09-22 LAB — MAGNESIUM: Magnesium: 1.6 mg/dL — ABNORMAL LOW (ref 1.7–2.4)

## 2021-09-22 NOTE — Progress Notes (Signed)
Pulmonary Critical Care Medicine [facility] [place]   PULMONARY CRITICAL CARE SERVICE  PROGRESS NOTE     Leon Davidson  YPP:[id number]  DOB: 11-23-1966   DOA: 09/07/2021  Referring Physician: Luna Kitchens, MD  HPI: Leon Davidson is a 55 y.o. male being followed for ventilator/airway/oxygen weaning Acute on Chronic Respiratory Failure.  Patient is comfortable right now without distress has been on T collar today's goal is 8 hours  Medications: Reviewed on Rounds  Physical Exam:  Vitals: Temperature is 95.8 pulse 57 respiratory is 15 blood pressure is 99/61 saturations 100%  Ventilator Settings on T collar FiO2 of 35%  General: Comfortable at this time Neck: supple Cardiovascular: no malignant arrhythmias Respiratory: Scattered rhonchi expansion is equal Skin: no rash seen on limited exam Musculoskeletal: No gross abnormality Psychiatric:unable to assess Neurologic:no involuntary movements         Lab Data:   Basic Metabolic Panel: Recent Labs  Lab 09/16/21 0442 09/17/21 0605 09/18/21 0427 09/19/21 0514 09/20/21 0433 09/22/21 0417  NA 138 135 137 136  --  136  K 3.4* 3.5 3.5 4.0 3.9 3.9  CL 107 106 108 108  --  107  CO2 25 21* 24 23  --  22  GLUCOSE 118* 95 72 80  --  130*  BUN 17 15 12 14   --  29*  CREATININE 0.73 0.71 0.74 0.66  --  0.79  CALCIUM 8.3* 8.2* 8.4* 8.7*  --  8.7*  MG 1.6* 1.6* 1.8 1.6* 1.7 1.6*  PHOS 3.2  --  2.6 2.5  --   --     ABG: No results for input(s): PHART, PCO2ART, PO2ART, HCO3, O2SAT in the last 168 hours.  Liver Function Tests: Recent Labs  Lab 09/16/21 0442 09/19/21 0514  AST 24 29  ALT 21 20  ALKPHOS 131* 126  BILITOT 0.6 0.6  PROT 5.8* 6.3*  ALBUMIN 1.8* 1.8*   No results for input(s): LIPASE, AMYLASE in the last 168 hours. No results for input(s): AMMONIA in the last 168 hours.  CBC: Recent Labs  Lab 09/16/21 0442 09/18/21 0427 09/22/21 0417  WBC 7.1 6.3 5.4  HGB 9.4* 10.2* 8.8*  HCT 30.0*  32.8* 28.2*  MCV 92.9 91.6 91.9  PLT 227 199 109*    Cardiac Enzymes: No results for input(s): CKTOTAL, CKMB, CKMBINDEX, TROPONINI in the last 168 hours.  BNP (last 3 results) No results for input(s): BNP in the last 8760 hours.  ProBNP (last 3 results) No results for input(s): PROBNP in the last 8760 hours.  Radiological Exams: DG Abd 1 View  Result Date: 09/20/2021 CLINICAL DATA:  NG tube placement EXAM: ABDOMEN - 1 VIEW COMPARISON:  09/20/2021 FINDINGS: Esophageal tube tip overlies the stomach. Upper gas pattern is unremarkable. IMPRESSION: Esophageal tube tip overlies the stomach Electronically Signed   By: 09/22/2021 M.D.   On: 09/20/2021 19:02    Assessment/Plan Active Problems:   Acute on chronic respiratory failure with hypoxia (HCC)   Alcoholic cirrhosis of liver with ascites (HCC)   Acute metabolic encephalopathy   Aspiration pneumonia of both lower lobes due to gastric secretions (HCC)   Alcohol abuse with withdrawal (HCC)   Acute on chronic respiratory failure hypoxia plan is to wean on T collar as tolerated patient's goal is 8 hours Alcohol cirrhosis supportive care we will continue to monitor Metabolic encephalopathy no change Aspiration pneumonia treated Alcohol withdrawal supportive care we will continue to follow along   I have personally seen and  evaluated the patient, evaluated laboratory and imaging results, formulated the assessment and plan and placed orders. The Patient requires high complexity decision making with multiple systems involvement.  Rounds were done with the Respiratory Therapy Director and Staff therapists and discussed with nursing staff also.  Allyne Gee, MD [facility] Pulmonary Critical Care Medicine Sleep Medicine

## 2021-09-23 DIAGNOSIS — K7031 Alcoholic cirrhosis of liver with ascites: Secondary | ICD-10-CM | POA: Diagnosis not present

## 2021-09-23 DIAGNOSIS — F10139 Alcohol abuse with withdrawal, unspecified: Secondary | ICD-10-CM | POA: Diagnosis not present

## 2021-09-23 DIAGNOSIS — G9341 Metabolic encephalopathy: Secondary | ICD-10-CM | POA: Diagnosis not present

## 2021-09-23 DIAGNOSIS — J9621 Acute and chronic respiratory failure with hypoxia: Secondary | ICD-10-CM | POA: Diagnosis not present

## 2021-09-23 LAB — MAGNESIUM: Magnesium: 1.9 mg/dL (ref 1.7–2.4)

## 2021-09-23 LAB — HEPARIN INDUCED PLATELET AB (HIT ANTIBODY): Heparin Induced Plt Ab: 1.077 OD — ABNORMAL HIGH (ref 0.000–0.400)

## 2021-09-23 NOTE — Progress Notes (Signed)
Pulmonary Critical Care Medicine [facility] [place]   PULMONARY CRITICAL CARE SERVICE  PROGRESS NOTE     Leon Davidson  MGQ:[id number]  DOB: 14-Jun-1966   DOA: 09/07/2021  Referring Physician: Luna Kitchens, MD  HPI: Leon Davidson is a 55 y.o. male being followed for ventilator/airway/oxygen weaning Acute on Chronic Respiratory Failure.  At this time patient is on T collar the goal is for 16 hours today.  Medications: Reviewed on Rounds  Physical Exam:  Vitals: Temperature 95.3 pulse 65 respiratory rate is 13 blood pressure is 132/71 saturations 96%  Ventilator Settings on T collar 16-hour goal  General: Comfortable at this time Neck: supple Cardiovascular: no malignant arrhythmias Respiratory: No rhonchi very coarse breath sounds Skin: no rash seen on limited exam Musculoskeletal: No gross abnormality Psychiatric:unable to assess Neurologic:no involuntary movements         Lab Data:   Basic Metabolic Panel: Recent Labs  Lab 09/17/21 0605 09/18/21 0427 09/19/21 0514 09/20/21 0433 09/22/21 0417 09/23/21 0425  NA 135 137 136  --  136  --   K 3.5 3.5 4.0 3.9 3.9  --   CL 106 108 108  --  107  --   CO2 21* 24 23  --  22  --   GLUCOSE 95 72 80  --  130*  --   BUN 15 12 14   --  29*  --   CREATININE 0.71 0.74 0.66  --  0.79  --   CALCIUM 8.2* 8.4* 8.7*  --  8.7*  --   MG 1.6* 1.8 1.6* 1.7 1.6* 1.9  PHOS  --  2.6 2.5  --   --   --     ABG: No results for input(s): PHART, PCO2ART, PO2ART, HCO3, O2SAT in the last 168 hours.  Liver Function Tests: Recent Labs  Lab 09/19/21 0514  AST 29  ALT 20  ALKPHOS 126  BILITOT 0.6  PROT 6.3*  ALBUMIN 1.8*   No results for input(s): LIPASE, AMYLASE in the last 168 hours. No results for input(s): AMMONIA in the last 168 hours.  CBC: Recent Labs  Lab 09/18/21 0427 09/22/21 0417  WBC 6.3 5.4  HGB 10.2* 8.8*  HCT 32.8* 28.2*  MCV 91.6 91.9  PLT 199 109*    Cardiac Enzymes: No results for input(s):  CKTOTAL, CKMB, CKMBINDEX, TROPONINI in the last 168 hours.  BNP (last 3 results) No results for input(s): BNP in the last 8760 hours.  ProBNP (last 3 results) No results for input(s): PROBNP in the last 8760 hours.  Radiological Exams: No results found.  Assessment/Plan Active Problems:   Acute on chronic respiratory failure with hypoxia (HCC)   Alcoholic cirrhosis of liver with ascites (HCC)   Acute metabolic encephalopathy   Aspiration pneumonia of both lower lobes due to gastric secretions (HCC)   Alcohol abuse with withdrawal (HCC)   Acute on chronic respiratory failure with hypoxia we will continue with T collar goal of 16 hours as discussed.  We will continue pulmonary toilet and secretion management. Alcohol cirrhosis at baseline we will continue to follow along closely. Acute metabolic encephalopathy is a little bit more awake Aspiration pneumonia has been treated with antibiotics Alcohol withdrawal no sign of active withdrawal at this time.   I have personally seen and evaluated the patient, evaluated laboratory and imaging results, formulated the assessment and plan and placed orders. The Patient requires high complexity decision making with multiple systems involvement.  Rounds were done with the Respiratory  Therapy Director and Staff therapists and discussed with nursing staff also.  Allyne Gee, MD [facility] Pulmonary Critical Care Medicine Sleep Medicine

## 2021-09-24 DIAGNOSIS — K7031 Alcoholic cirrhosis of liver with ascites: Secondary | ICD-10-CM | POA: Diagnosis not present

## 2021-09-24 DIAGNOSIS — J9621 Acute and chronic respiratory failure with hypoxia: Secondary | ICD-10-CM | POA: Diagnosis not present

## 2021-09-24 DIAGNOSIS — F10139 Alcohol abuse with withdrawal, unspecified: Secondary | ICD-10-CM | POA: Diagnosis not present

## 2021-09-24 DIAGNOSIS — G9341 Metabolic encephalopathy: Secondary | ICD-10-CM | POA: Diagnosis not present

## 2021-09-24 LAB — CBC
HCT: 27.8 % — ABNORMAL LOW (ref 39.0–52.0)
Hemoglobin: 8.8 g/dL — ABNORMAL LOW (ref 13.0–17.0)
MCH: 28.6 pg (ref 26.0–34.0)
MCHC: 31.7 g/dL (ref 30.0–36.0)
MCV: 90.3 fL (ref 80.0–100.0)
Platelets: 83 10*3/uL — ABNORMAL LOW (ref 150–400)
RBC: 3.08 MIL/uL — ABNORMAL LOW (ref 4.22–5.81)
RDW: 16.1 % — ABNORMAL HIGH (ref 11.5–15.5)
WBC: 6.7 10*3/uL (ref 4.0–10.5)
nRBC: 0 % (ref 0.0–0.2)

## 2021-09-24 NOTE — Progress Notes (Signed)
Pulmonary Critical Care Medicine [facility] [place]   PULMONARY CRITICAL CARE SERVICE  PROGRESS NOTE     Leon Davidson  HUD:[id number]  DOB: [birth year]   DOA: 09/07/2021  Referring Physician: Luna Kitchens, MD  HPI: Leon Davidson is a 55 y.o. male being followed for ventilator/airway/oxygen weaning Acute on Chronic Respiratory Failure.  Patient is afebrile right now resting comfortably without distress at this time has been on T collar  Medications: Reviewed on Rounds  Physical Exam:  Vitals: Temperature is 97.5 pulse 70 respiratory is 15 blood pressure is 107/65 saturations 98%  Ventilator Settings on T collar FiO2 is 35%  General: Comfortable at this time Neck: supple Cardiovascular: no malignant arrhythmias Respiratory: Scattered rhonchi expansion is equal Skin: no rash seen on limited exam Musculoskeletal: No gross abnormality Psychiatric:unable to assess Neurologic:no involuntary movements         Lab Data:   Basic Metabolic Panel: Recent Labs  Lab 09/18/21 0427 09/19/21 0514 09/20/21 0433 09/22/21 0417 09/23/21 0425  NA 137 136  --  136  --   K 3.5 4.0 3.9 3.9  --   CL 108 108  --  107  --   CO2 24 23  --  22  --   GLUCOSE 72 80  --  130*  --   BUN 12 14  --  29*  --   CREATININE 0.74 0.66  --  0.79  --   CALCIUM 8.4* 8.7*  --  8.7*  --   MG 1.8 1.6* 1.7 1.6* 1.9  PHOS 2.6 2.5  --   --   --     ABG: No results for input(s): PHART, PCO2ART, PO2ART, HCO3, O2SAT in the last 168 hours.  Liver Function Tests: Recent Labs  Lab 09/19/21 0514  AST 29  ALT 20  ALKPHOS 126  BILITOT 0.6  PROT 6.3*  ALBUMIN 1.8*   No results for input(s): LIPASE, AMYLASE in the last 168 hours. No results for input(s): AMMONIA in the last 168 hours.  CBC: Recent Labs  Lab 09/18/21 0427 09/22/21 0417 09/24/21 0337  WBC 6.3 5.4 6.7  HGB 10.2* 8.8* 8.8*  HCT 32.8* 28.2* 27.8*  MCV 91.6 91.9 90.3  PLT 199 109* 83*    Cardiac Enzymes: No results  for input(s): CKTOTAL, CKMB, CKMBINDEX, TROPONINI in the last 168 hours.  BNP (last 3 results) No results for input(s): BNP in the last 8760 hours.  ProBNP (last 3 results) No results for input(s): PROBNP in the last 8760 hours.  Radiological Exams: No results found.  Assessment/Plan Active Problems:   Acute on chronic respiratory failure with hypoxia (HCC)   Alcoholic cirrhosis of liver with ascites (HCC)   Acute metabolic encephalopathy   Aspiration pneumonia of both lower lobes due to gastric secretions (HCC)   Alcohol abuse with withdrawal (HCC)   Acute on chronic respiratory failure with hypoxia patient is T collar on 35% FiO2 seems to be tolerating well the goal is for 24 hours ) Alcoholic cirrhosis patient still has some ascites not as bad Metabolic encephalopathy no change we will continue with supportive care Aspiration pneumonia has been treated with antibiotics Alcohol withdrawal no change continue to follow along   I have personally seen and evaluated the patient, evaluated laboratory and imaging results, formulated the assessment and plan and placed orders. The Patient requires high complexity decision making with multiple systems involvement.  Rounds were done with the Respiratory Therapy Director and Staff therapists and discussed with nursing  staff also.  Allyne Gee, MD [facility] Pulmonary Critical Care Medicine Sleep Medicine

## 2021-09-25 ENCOUNTER — Other Ambulatory Visit (HOSPITAL_COMMUNITY): Payer: Medicaid Other

## 2021-09-25 LAB — CBC
HCT: 31.3 % — ABNORMAL LOW (ref 39.0–52.0)
Hemoglobin: 9.4 g/dL — ABNORMAL LOW (ref 13.0–17.0)
MCH: 27.9 pg (ref 26.0–34.0)
MCHC: 30 g/dL (ref 30.0–36.0)
MCV: 92.9 fL (ref 80.0–100.0)
Platelets: 70 10*3/uL — ABNORMAL LOW (ref 150–400)
RBC: 3.37 MIL/uL — ABNORMAL LOW (ref 4.22–5.81)
RDW: 16.1 % — ABNORMAL HIGH (ref 11.5–15.5)
WBC: 7.3 10*3/uL (ref 4.0–10.5)
nRBC: 0 % (ref 0.0–0.2)

## 2021-09-25 LAB — BLOOD GAS, ARTERIAL
Acid-Base Excess: 1.7 mmol/L (ref 0.0–2.0)
Acid-Base Excess: 0.6 mmol/L (ref 0.0–2.0)
Bicarbonate: 26 mmol/L (ref 20.0–28.0)
Bicarbonate: 26.4 mmol/L (ref 20.0–28.0)
FIO2: 35
FIO2: 60
O2 Saturation: 97.1 %
O2 Saturation: 96.5 %
Patient temperature: 37
Patient temperature: 37
pCO2 arterial: 42.8 mmHg (ref 32.0–48.0)
pCO2 arterial: 56.4 mmHg — ABNORMAL HIGH (ref 32.0–48.0)
pH, Arterial: 7.4 (ref 7.350–7.450)
pH, Arterial: 7.292 — ABNORMAL LOW (ref 7.350–7.450)
pO2, Arterial: 84.9 mmHg (ref 83.0–108.0)
pO2, Arterial: 91.4 mmHg (ref 83.0–108.0)

## 2021-09-25 LAB — PHOSPHORUS: Phosphorus: 1.9 mg/dL — ABNORMAL LOW (ref 2.5–4.6)

## 2021-09-25 LAB — BASIC METABOLIC PANEL
Anion gap: 5 (ref 5–15)
BUN: 28 mg/dL — ABNORMAL HIGH (ref 6–20)
CO2: 23 mmol/L (ref 22–32)
Calcium: 8.9 mg/dL (ref 8.9–10.3)
Chloride: 108 mmol/L (ref 98–111)
Creatinine, Ser: 0.74 mg/dL (ref 0.61–1.24)
GFR, Estimated: >60 mL/min (ref >60)
Glucose, Bld: 194 mg/dL — ABNORMAL HIGH (ref 70–99)
Potassium: 4.3 mmol/L (ref 3.5–5.1)
Sodium: 136 mmol/L (ref 135–145)

## 2021-09-25 LAB — MAGNESIUM: Magnesium: 1.5 mg/dL — ABNORMAL LOW (ref 1.7–2.4)

## 2021-09-26 DIAGNOSIS — K7031 Alcoholic cirrhosis of liver with ascites: Secondary | ICD-10-CM | POA: Diagnosis not present

## 2021-09-26 DIAGNOSIS — G9341 Metabolic encephalopathy: Secondary | ICD-10-CM | POA: Diagnosis not present

## 2021-09-26 DIAGNOSIS — F10139 Alcohol abuse with withdrawal, unspecified: Secondary | ICD-10-CM | POA: Diagnosis not present

## 2021-09-26 DIAGNOSIS — J9621 Acute and chronic respiratory failure with hypoxia: Secondary | ICD-10-CM | POA: Diagnosis not present

## 2021-09-26 LAB — BASIC METABOLIC PANEL
Anion gap: 8 (ref 5–15)
BUN: 22 mg/dL — ABNORMAL HIGH (ref 6–20)
CO2: 26 mmol/L (ref 22–32)
Calcium: 9 mg/dL (ref 8.9–10.3)
Chloride: 104 mmol/L (ref 98–111)
Creatinine, Ser: 0.71 mg/dL (ref 0.61–1.24)
GFR, Estimated: >60 mL/min (ref >60)
Glucose, Bld: 147 mg/dL — ABNORMAL HIGH (ref 70–99)
Potassium: 4.2 mmol/L (ref 3.5–5.1)
Sodium: 138 mmol/L (ref 135–145)

## 2021-09-26 LAB — CBC
HCT: 30.5 % — ABNORMAL LOW (ref 39.0–52.0)
Hemoglobin: 9.4 g/dL — ABNORMAL LOW (ref 13.0–17.0)
MCH: 28.3 pg (ref 26.0–34.0)
MCHC: 30.8 g/dL (ref 30.0–36.0)
MCV: 91.9 fL (ref 80.0–100.0)
Platelets: 76 10*3/uL — ABNORMAL LOW (ref 150–400)
RBC: 3.32 MIL/uL — ABNORMAL LOW (ref 4.22–5.81)
RDW: 15.9 % — ABNORMAL HIGH (ref 11.5–15.5)
WBC: 9.7 10*3/uL (ref 4.0–10.5)
nRBC: 0 % (ref 0.0–0.2)

## 2021-09-26 LAB — PHOSPHORUS: Phosphorus: 2.5 mg/dL (ref 2.5–4.6)

## 2021-09-26 LAB — MAGNESIUM: Magnesium: 1.6 mg/dL — ABNORMAL LOW (ref 1.7–2.4)

## 2021-09-26 LAB — TRIGLYCERIDES: Triglycerides: 69 mg/dL (ref <150)

## 2021-09-26 NOTE — Progress Notes (Signed)
Pulmonary Critical Care Medicine [facility] [place]   PULMONARY CRITICAL CARE SERVICE  PROGRESS NOTE     Leon Davidson  LNL:[id number]  DOB: [birth year]   DOA: 09/07/2021  Referring Physician: Luna Kitchens, MD  HPI: Leon Davidson is a 55 y.o. male being followed for ventilator/airway/oxygen weaning Acute on Chronic Respiratory Failure.  Patient is doing well has been on T collar attempting to go for 24 hours.  Medications: Reviewed on Rounds  Physical Exam:  Vitals: Temperature is 96.8 pulse 67 respiratory is 14 blood pressure is 159/94 saturations 99%  Ventilator Settings on T collar FiO2 of 35%  General: Comfortable at this time Neck: supple Cardiovascular: no malignant arrhythmias Respiratory: No rhonchi very coarse breath sounds Skin: no rash seen on limited exam Musculoskeletal: No gross abnormality Psychiatric:unable to assess Neurologic:no involuntary movements         Lab Data:   Basic Metabolic Panel: Recent Labs  Lab 09/20/21 0433 09/22/21 0417 09/23/21 0425 09/25/21 0315  NA  --  136  --  136  K 3.9 3.9  --  4.3  CL  --  107  --  108  CO2  --  22  --  23  GLUCOSE  --  130*  --  194*  BUN  --  29*  --  28*  CREATININE  --  0.79  --  0.74  CALCIUM  --  8.7*  --  8.9  MG 1.7 1.6* 1.9 1.5*  PHOS  --   --   --  1.9*    ABG: Recent Labs  Lab 09/25/21 0910 09/25/21 1154  PHART 7.400 7.292*  PCO2ART 42.8 56.4*  PO2ART 84.9 91.4  HCO3 26.0 26.4  O2SAT 97.1 96.5    Liver Function Tests: No results for input(s): AST, ALT, ALKPHOS, BILITOT, PROT, ALBUMIN in the last 168 hours. No results for input(s): LIPASE, AMYLASE in the last 168 hours. No results for input(s): AMMONIA in the last 168 hours.  CBC: Recent Labs  Lab 09/22/21 0417 09/24/21 0337 09/25/21 0315  WBC 5.4 6.7 7.3  HGB 8.8* 8.8* 9.4*  HCT 28.2* 27.8* 31.3*  MCV 91.9 90.3 92.9  PLT 109* 83* 70*    Cardiac Enzymes: No results for input(s): CKTOTAL, CKMB,  CKMBINDEX, TROPONINI in the last 168 hours.  BNP (last 3 results) No results for input(s): BNP in the last 8760 hours.  ProBNP (last 3 results) No results for input(s): PROBNP in the last 8760 hours.  Radiological Exams: DG Abd Portable 1V  Result Date: 09/25/2021 CLINICAL DATA:  Enteric tube EXAM: PORTABLE ABDOMEN - 1 VIEW COMPARISON:  September 20, 2021 FINDINGS: Gaseous distension of the stomach. Enteric tube tip and side port project over the stomach. There is diffuse gaseous distension of loops of large and small bowel without frank dilation. IMPRESSION: Enteric tube tip and side port project over the stomach. Electronically Signed   By: Meda Klinefelter M.D.   On: 09/25/2021 12:25   [place] ASCITES (ABDOMEN LIMITED)  Result Date: 09/25/2021 CLINICAL DATA:  Distended abdomen. EXAM: LIMITED ABDOMEN ULTRASOUND FOR ASCITES TECHNIQUE: Limited ultrasound survey for ascites was performed in all four abdominal quadrants. COMPARISON:  Abdominal radiograph performed earlier today as well as abdominal ultrasound dated 09/08/2021. FINDINGS: Moderate volume ascites is noted in all 4 quadrants. IMPRESSION: Moderate volume ascites. Electronically Signed   By: Romona Curls M.D.   On: 09/25/2021 14:04    Assessment/Plan Active Problems:   Acute on chronic respiratory failure with hypoxia (HCC)  Alcoholic cirrhosis of liver with ascites (HCC)   Acute metabolic encephalopathy   Aspiration pneumonia of both lower lobes due to gastric secretions (HCC)   Alcohol abuse with withdrawal (HCC)   Acute on chronic respiratory failure hypoxia plan is to continue with weaning for 24 hours on the T collar.  Titrate oxygen as tolerated continue with pulmonary toilet Alcohol cirrhosis supportive care last ultrasound showed moderate degree of ascites may benefit from removal in terms of continuing to wean Metabolic encephalopathy no change Aspiration pneumonia has been treated with antibiotics following up on  x-rays Alcohol withdrawal no sign of active withdrawal right now   I have personally seen and evaluated the patient, evaluated laboratory and imaging results, formulated the assessment and plan and placed orders. The Patient requires high complexity decision making with multiple systems involvement.  Rounds were done with the Respiratory Therapy Director and Staff therapists and discussed with nursing staff also.  Yevonne Pax, MD [facility] Pulmonary Critical Care Medicine Sleep Medicine

## 2021-09-27 ENCOUNTER — Other Ambulatory Visit (HOSPITAL_COMMUNITY): Payer: Medicaid Other

## 2021-09-27 DIAGNOSIS — G9341 Metabolic encephalopathy: Secondary | ICD-10-CM | POA: Diagnosis not present

## 2021-09-27 DIAGNOSIS — J9621 Acute and chronic respiratory failure with hypoxia: Secondary | ICD-10-CM | POA: Diagnosis not present

## 2021-09-27 DIAGNOSIS — K7031 Alcoholic cirrhosis of liver with ascites: Secondary | ICD-10-CM | POA: Diagnosis not present

## 2021-09-27 DIAGNOSIS — F10139 Alcohol abuse with withdrawal, unspecified: Secondary | ICD-10-CM | POA: Diagnosis not present

## 2021-09-27 HISTORY — PX: IR PARACENTESIS: IMG2679

## 2021-09-27 LAB — BLOOD GAS, ARTERIAL
Acid-Base Excess: 2.8 mmol/L — ABNORMAL HIGH (ref 0.0–2.0)
Bicarbonate: 27.3 mmol/L (ref 20.0–28.0)
FIO2: 35
O2 Saturation: 97.1 %
Patient temperature: 37
pCO2 arterial: 45 mmHg (ref 32.0–48.0)
pH, Arterial: 7.4 (ref 7.350–7.450)
pO2, Arterial: 87.7 mmHg (ref 83.0–108.0)

## 2021-09-27 LAB — COMPREHENSIVE METABOLIC PANEL
ALT: 21 U/L (ref 0–44)
AST: 24 U/L (ref 15–41)
Albumin: 1.7 g/dL — ABNORMAL LOW (ref 3.5–5.0)
Alkaline Phosphatase: 108 U/L (ref 38–126)
Anion gap: 6 (ref 5–15)
BUN: 23 mg/dL — ABNORMAL HIGH (ref 6–20)
CO2: 26 mmol/L (ref 22–32)
Calcium: 9.2 mg/dL (ref 8.9–10.3)
Chloride: 105 mmol/L (ref 98–111)
Creatinine, Ser: 0.71 mg/dL (ref 0.61–1.24)
GFR, Estimated: >60 mL/min (ref >60)
Glucose, Bld: 183 mg/dL — ABNORMAL HIGH (ref 70–99)
Potassium: 3.9 mmol/L (ref 3.5–5.1)
Sodium: 137 mmol/L (ref 135–145)
Total Bilirubin: 0.5 mg/dL (ref 0.3–1.2)
Total Protein: 6.5 g/dL (ref 6.5–8.1)

## 2021-09-27 LAB — CBC
HCT: 31 % — ABNORMAL LOW (ref 39.0–52.0)
Hemoglobin: 9.6 g/dL — ABNORMAL LOW (ref 13.0–17.0)
MCH: 28.1 pg (ref 26.0–34.0)
MCHC: 31 g/dL (ref 30.0–36.0)
MCV: 90.6 fL (ref 80.0–100.0)
Platelets: 72 10*3/uL — ABNORMAL LOW (ref 150–400)
RBC: 3.42 MIL/uL — ABNORMAL LOW (ref 4.22–5.81)
RDW: 15.9 % — ABNORMAL HIGH (ref 11.5–15.5)
WBC: 7.4 10*3/uL (ref 4.0–10.5)
nRBC: 0 % (ref 0.0–0.2)

## 2021-09-27 LAB — BODY FLUID CELL COUNT WITH DIFFERENTIAL
Eos, Fluid: 0 %
Lymphs, Fluid: 26 %
Monocyte-Macrophage-Serous Fluid: 8 % — ABNORMAL LOW (ref 50–90)
Neutrophil Count, Fluid: 66 % — ABNORMAL HIGH (ref 0–25)
Total Nucleated Cell Count, Fluid: 325 cu mm (ref 0–1000)

## 2021-09-27 LAB — GRAM STAIN

## 2021-09-27 LAB — GLUCOSE, PLEURAL OR PERITONEAL FLUID: Glucose, Fluid: 222 mg/dL

## 2021-09-27 LAB — PHOSPHORUS: Phosphorus: 2.8 mg/dL (ref 2.5–4.6)

## 2021-09-27 LAB — MAGNESIUM: Magnesium: 1.7 mg/dL (ref 1.7–2.4)

## 2021-09-27 MED ORDER — LIDOCAINE HCL 1 % IJ SOLN
INTRAMUSCULAR | Status: DC | PRN
  Administered 2021-09-27: 5 mL via INTRADERMAL

## 2021-09-27 MED ORDER — LIDOCAINE HCL 1 % IJ SOLN
INTRAMUSCULAR | Status: AC
  Filled 2021-09-27: qty 20

## 2021-09-27 NOTE — Procedures (Signed)
PROCEDURE SUMMARY:  Successful US guided paracentesis from right abdomen.  Yielded 5.4 L of clear yellow fluid.  No immediate complications.  Pt tolerated well.   Specimen sent for labs.  EBL < 2 mL  Mickie Kay, NP 09/27/2021 1:46 PM

## 2021-09-27 NOTE — Progress Notes (Signed)
Pulmonary Critical Care Medicine [facility] [place]   PULMONARY CRITICAL CARE SERVICE  PROGRESS NOTE     Leon Davidson  HCW:[id number]  DOB: 1966-11-27   DOA: 09/07/2021  Referring Physician: Luna Kitchens, MD  HPI: Leon Davidson is a 55 y.o. male being followed for ventilator/airway/oxygen weaning Acute on Chronic Respiratory Failure.  Patient scheduled for paracentesis continues to wean has been on T collar with completing 24 hours  Medications: Reviewed on Rounds  Physical Exam:  Vitals: Temperature is 98.0 pulse 78 respiratory 22 blood pressure 177/95 saturations 100%  Ventilator Settings off the ventilator on T collar  General: Comfortable at this time Neck: supple Cardiovascular: no malignant arrhythmias Respiratory: Scattered rhonchi expansion is equal Skin: no rash seen on limited exam Musculoskeletal: No gross abnormality Psychiatric:unable to assess Neurologic:no involuntary movements         Lab Data:   Basic Metabolic Panel: Recent Labs  Lab 09/22/21 0417 09/23/21 0425 09/25/21 0315 09/26/21 0913 09/27/21 0359  NA 136  --  136 138 137  K 3.9  --  4.3 4.2 3.9  CL 107  --  108 104 105  CO2 22  --  23 26 26   GLUCOSE 130*  --  194* 147* 183*  BUN 29*  --  28* 22* 23*  CREATININE 0.79  --  0.74 0.71 0.71  CALCIUM 8.7*  --  8.9 9.0 9.2  MG 1.6* 1.9 1.5* 1.6* 1.7  PHOS  --   --  1.9* 2.5 2.8    ABG: Recent Labs  Lab 09/25/21 0910 09/25/21 1154 09/27/21 0815  PHART 7.400 7.292* 7.400  PCO2ART 42.8 56.4* 45.0  PO2ART 84.9 91.4 87.7  HCO3 26.0 26.4 27.3  O2SAT 97.1 96.5 97.1    Liver Function Tests: Recent Labs  Lab 09/27/21 0359  AST 24  ALT 21  ALKPHOS 108  BILITOT 0.5  PROT 6.5  ALBUMIN 1.7*   No results for input(s): LIPASE, AMYLASE in the last 168 hours. No results for input(s): AMMONIA in the last 168 hours.  CBC: Recent Labs  Lab 09/22/21 0417 09/24/21 0337 09/25/21 0315 09/26/21 0913 09/27/21 0359  WBC  5.4 6.7 7.3 9.7 7.4  HGB 8.8* 8.8* 9.4* 9.4* 9.6*  HCT 28.2* 27.8* 31.3* 30.5* 31.0*  MCV 91.9 90.3 92.9 91.9 90.6  PLT 109* 83* 70* 76* 72*    Cardiac Enzymes: No results for input(s): CKTOTAL, CKMB, CKMBINDEX, TROPONINI in the last 168 hours.  BNP (last 3 results) No results for input(s): BNP in the last 8760 hours.  ProBNP (last 3 results) No results for input(s): PROBNP in the last 8760 hours.  Radiological Exams: DG Abd Portable 1V  Result Date: 09/25/2021 CLINICAL DATA:  Enteric tube EXAM: PORTABLE ABDOMEN - 1 VIEW COMPARISON:  September 20, 2021 FINDINGS: Gaseous distension of the stomach. Enteric tube tip and side port project over the stomach. There is diffuse gaseous distension of loops of large and small bowel without frank dilation. IMPRESSION: Enteric tube tip and side port project over the stomach. Electronically Signed   By: September 22, 2021 M.D.   On: 09/25/2021 12:25   09/27/2021 ASCITES (ABDOMEN LIMITED)  Result Date: 09/25/2021 CLINICAL DATA:  Distended abdomen. EXAM: LIMITED ABDOMEN ULTRASOUND FOR ASCITES TECHNIQUE: Limited ultrasound survey for ascites was performed in all four abdominal quadrants. COMPARISON:  Abdominal radiograph performed earlier today as well as abdominal ultrasound dated 09/08/2021. FINDINGS: Moderate volume ascites is noted in all 4 quadrants. IMPRESSION: Moderate volume ascites. Electronically Signed  By: Romona Curls M.D.   On: 09/25/2021 14:04    Assessment/Plan Active Problems:   Acute on chronic respiratory failure with hypoxia (HCC)   Alcoholic cirrhosis of liver with ascites (HCC)   Acute metabolic encephalopathy   Aspiration pneumonia of both lower lobes due to gastric secretions (HCC)   Alcohol abuse with withdrawal (HCC)   Acute on chronic respiratory failure with hypoxia plan is going to be to get paracentesis done also I did order chest x-ray and follow-up ABG on him Alcoholic cirrhosis supportive care prognosis remains  guarded Metabolic encephalopathy patient remains at baseline Aspiration pneumonia has been treated Alcohol abuse no change continue with supportive care   I have personally seen and evaluated the patient, evaluated laboratory and imaging results, formulated the assessment and plan and placed orders. The Patient requires high complexity decision making with multiple systems involvement.  Rounds were done with the Respiratory Therapy Director and Staff therapists and discussed with nursing staff also.  Yevonne Pax, MD [facility] Pulmonary Critical Care Medicine Sleep Medicine

## 2021-09-28 DIAGNOSIS — K7031 Alcoholic cirrhosis of liver with ascites: Secondary | ICD-10-CM | POA: Diagnosis not present

## 2021-09-28 DIAGNOSIS — G9341 Metabolic encephalopathy: Secondary | ICD-10-CM | POA: Diagnosis not present

## 2021-09-28 DIAGNOSIS — J9621 Acute and chronic respiratory failure with hypoxia: Secondary | ICD-10-CM | POA: Diagnosis not present

## 2021-09-28 DIAGNOSIS — F10139 Alcohol abuse with withdrawal, unspecified: Secondary | ICD-10-CM | POA: Diagnosis not present

## 2021-09-28 LAB — CYTOLOGY - NON PAP

## 2021-09-28 LAB — MAGNESIUM: Magnesium: 1.6 mg/dL — ABNORMAL LOW (ref 1.7–2.4)

## 2021-09-28 NOTE — Progress Notes (Signed)
Pulmonary Critical Care Medicine [facility] [place]   PULMONARY CRITICAL CARE SERVICE  PROGRESS NOTE     Leon Davidson  GHW:[id number]  DOB: 1966/09/18   DOA: 09/07/2021  Referring Physician: Luna Kitchens, MD  HPI: Leon Davidson is a 55 y.o. male being followed for ventilator/airway/oxygen weaning Acute on Chronic Respiratory Failure.  Patient is currently on T collar will be completing 72 hours  Medications: Reviewed on Rounds  Physical Exam:  Vitals: Temperature is 95.6 pulse 69 respiratory 29 blood pressure 148/84 saturations 97%  Ventilator Settings on T collar  General: Comfortable at this time Neck: supple Cardiovascular: no malignant arrhythmias Respiratory: Scattered rhonchi expansion is equal Skin: no rash seen on limited exam Musculoskeletal: No gross abnormality Psychiatric:unable to assess Neurologic:no involuntary movements         Lab Data:   Basic Metabolic Panel: Recent Labs  Lab 09/22/21 0417 09/23/21 0425 09/25/21 0315 09/26/21 0913 09/27/21 0359 09/28/21 0417  NA 136  --  136 138 137  --   K 3.9  --  4.3 4.2 3.9  --   CL 107  --  108 104 105  --   CO2 22  --  23 26 26   --   GLUCOSE 130*  --  194* 147* 183*  --   BUN 29*  --  28* 22* 23*  --   CREATININE 0.79  --  0.74 0.71 0.71  --   CALCIUM 8.7*  --  8.9 9.0 9.2  --   MG 1.6* 1.9 1.5* 1.6* 1.7 1.6*  PHOS  --   --  1.9* 2.5 2.8  --     ABG: Recent Labs  Lab 09/25/21 0910 09/25/21 1154 09/27/21 0815  PHART 7.400 7.292* 7.400  PCO2ART 42.8 56.4* 45.0  PO2ART 84.9 91.4 87.7  HCO3 26.0 26.4 27.3  O2SAT 97.1 96.5 97.1    Liver Function Tests: Recent Labs  Lab 09/27/21 0359  AST 24  ALT 21  ALKPHOS 108  BILITOT 0.5  PROT 6.5  ALBUMIN 1.7*   No results for input(s): LIPASE, AMYLASE in the last 168 hours. No results for input(s): AMMONIA in the last 168 hours.  CBC: Recent Labs  Lab 09/22/21 0417 09/24/21 0337 09/25/21 0315 09/26/21 0913 09/27/21 0359   WBC 5.4 6.7 7.3 9.7 7.4  HGB 8.8* 8.8* 9.4* 9.4* 9.6*  HCT 28.2* 27.8* 31.3* 30.5* 31.0*  MCV 91.9 90.3 92.9 91.9 90.6  PLT 109* 83* 70* 76* 72*    Cardiac Enzymes: No results for input(s): CKTOTAL, CKMB, CKMBINDEX, TROPONINI in the last 168 hours.  BNP (last 3 results) No results for input(s): BNP in the last 8760 hours.  ProBNP (last 3 results) No results for input(s): PROBNP in the last 8760 hours.  Radiological Exams: DG Chest Port 1 View  Result Date: 09/27/2021 CLINICAL DATA:  Respiratory failure EXAM: PORTABLE CHEST 1 VIEW COMPARISON:  Radiograph 09/12/2021 FINDINGS: Tracheostomy tube overlies the midthoracic trachea. Unchanged median sternotomy wires with fractured most superior wire. Unchanged cardiomediastinal silhouette. Persistent dense left basilar consolidation and trace left pleural effusion. Unchanged mild right medial basilar opacities. No visible pneumothorax. No acute osseous abnormality. IMPRESSION: Persistent dense left basilar consolidation which could represent lower lung collapse and/or infection. Unchanged milder right medial basilar opacities. Electronically Signed   By: 11/12/2021 M.D.   On: 09/27/2021 08:52   IR Paracentesis  Result Date: 09/27/2021 INDICATION: Patient with a history of alcoholic cirrhosis with recurrent ascites presents today for a diagnostic and  therapeutic paracentesis. EXAM: ULTRASOUND GUIDED PARACENTESIS MEDICATIONS: 1% lidocaine 10 mL COMPLICATIONS: None immediate. PROCEDURE: Informed written consent was obtained from the patient after a discussion of the risks, benefits and alternatives to treatment. A timeout was performed prior to the initiation of the procedure. Initial ultrasound scanning demonstrates a large amount of ascites within the right lower abdominal quadrant. The right lower abdomen was prepped and draped in the usual sterile fashion. 1% lidocaine was used for local anesthesia. Following this, a 19 gauge, 7-cm, Yueh  catheter was introduced. An ultrasound image was saved for documentation purposes. The paracentesis was performed. The catheter was removed and a dressing was applied. The patient tolerated the procedure well without immediate post procedural complication. FINDINGS: A total of approximately 5.4 L of clear yellow fluid was removed. Samples were sent to the laboratory as requested by the clinical team. IMPRESSION: Successful ultrasound-guided paracentesis yielding 5.4 liters of peritoneal fluid. Read by: Alwyn Ren, NP Electronically Signed   By: Malachy Moan M.D.   On: 09/27/2021 13:48    Assessment/Plan Active Problems:   Acute on chronic respiratory failure with hypoxia (HCC)   Alcoholic cirrhosis of liver with ascites (HCC)   Acute metabolic encephalopathy   Aspiration pneumonia of both lower lobes due to gastric secretions (HCC)   Alcohol abuse with withdrawal (HCC)   Acute on chronic respiratory failure hypoxia we will continue to wean on the T collar patient is going to be completing 72 hours today Alcoholic cirrhosis no change we will continue to follow along Metabolic encephalopathy at baseline Aspiration pneumonia has been treated with antibiotics Alcohol abuse no active withdrawal   I have personally seen and evaluated the patient, evaluated laboratory and imaging results, formulated the assessment and plan and placed orders. The Patient requires high complexity decision making with multiple systems involvement.  Rounds were done with the Respiratory Therapy Director and Staff therapists and discussed with nursing staff also.  Yevonne Pax, MD [facility] Pulmonary Critical Care Medicine Sleep Medicine

## 2021-09-29 ENCOUNTER — Other Ambulatory Visit (HOSPITAL_COMMUNITY): Payer: Medicaid Other

## 2021-09-29 DIAGNOSIS — J9621 Acute and chronic respiratory failure with hypoxia: Secondary | ICD-10-CM | POA: Diagnosis not present

## 2021-09-29 DIAGNOSIS — K7031 Alcoholic cirrhosis of liver with ascites: Secondary | ICD-10-CM | POA: Diagnosis not present

## 2021-09-29 DIAGNOSIS — G9341 Metabolic encephalopathy: Secondary | ICD-10-CM | POA: Diagnosis not present

## 2021-09-29 DIAGNOSIS — F10139 Alcohol abuse with withdrawal, unspecified: Secondary | ICD-10-CM | POA: Diagnosis not present

## 2021-09-29 LAB — CBC
HCT: 29.3 % — ABNORMAL LOW (ref 39.0–52.0)
Hemoglobin: 9.6 g/dL — ABNORMAL LOW (ref 13.0–17.0)
MCH: 28.2 pg (ref 26.0–34.0)
MCHC: 32.8 g/dL (ref 30.0–36.0)
MCV: 86.2 fL (ref 80.0–100.0)
Platelets: 83 10*3/uL — ABNORMAL LOW (ref 150–400)
RBC: 3.4 MIL/uL — ABNORMAL LOW (ref 4.22–5.81)
RDW: 15.2 % (ref 11.5–15.5)
WBC: 7.4 10*3/uL (ref 4.0–10.5)
nRBC: 0 % (ref 0.0–0.2)

## 2021-09-29 LAB — BASIC METABOLIC PANEL
Anion gap: 4 — ABNORMAL LOW (ref 5–15)
BUN: 19 mg/dL (ref 6–20)
CO2: 29 mmol/L (ref 22–32)
Calcium: 8.9 mg/dL (ref 8.9–10.3)
Chloride: 100 mmol/L (ref 98–111)
Creatinine, Ser: 0.54 mg/dL — ABNORMAL LOW (ref 0.61–1.24)
GFR, Estimated: >60 mL/min (ref >60)
Glucose, Bld: 94 mg/dL (ref 70–99)
Potassium: 3.7 mmol/L (ref 3.5–5.1)
Sodium: 133 mmol/L — ABNORMAL LOW (ref 135–145)

## 2021-09-29 LAB — PHOSPHORUS: Phosphorus: 3.4 mg/dL (ref 2.5–4.6)

## 2021-09-29 LAB — MAGNESIUM: Magnesium: 1.6 mg/dL — ABNORMAL LOW (ref 1.7–2.4)

## 2021-09-29 NOTE — Progress Notes (Signed)
Pulmonary Critical Care Medicine [facility] [place]   PULMONARY CRITICAL CARE SERVICE  PROGRESS NOTE     Leon Davidson  CWC:[id number]  DOB: [birth year]   DOA: 09/07/2021  Referring Physician: Luna Kitchens, MD  HPI: Leon Davidson is a 55 y.o. male being followed for ventilator/airway/oxygen weaning Acute on Chronic Respiratory Failure.  Afebrile resting comfortably right now without distress has been on T collar  Medications: Reviewed on Rounds  Physical Exam:  Vitals: Temperature is 97.6 pulse 62 respiratory rate 17 blood pressure 143/91 saturations 96%  Ventilator Settings on T collar  General: Comfortable at this time Neck: supple Cardiovascular: no malignant arrhythmias Respiratory: Coarse rhonchi expansion is equal Skin: no rash seen on limited exam Musculoskeletal: No gross abnormality Psychiatric:unable to assess Neurologic:no involuntary movements         Lab Data:   Basic Metabolic Panel: Recent Labs  Lab 09/25/21 0315 09/26/21 0913 09/27/21 0359 09/28/21 0417 09/29/21 0400  NA 136 138 137  --  133*  K 4.3 4.2 3.9  --  3.7  CL 108 104 105  --  100  CO2 23 26 26   --  29  GLUCOSE 194* 147* 183*  --  94  BUN 28* 22* 23*  --  19  CREATININE 0.74 0.71 0.71  --  0.54*  CALCIUM 8.9 9.0 9.2  --  8.9  MG 1.5* 1.6* 1.7 1.6* 1.6*  PHOS 1.9* 2.5 2.8  --  3.4    ABG: Recent Labs  Lab 09/25/21 0910 09/25/21 1154 09/27/21 0815  PHART 7.400 7.292* 7.400  PCO2ART 42.8 56.4* 45.0  PO2ART 84.9 91.4 87.7  HCO3 26.0 26.4 27.3  O2SAT 97.1 96.5 97.1    Liver Function Tests: Recent Labs  Lab 09/27/21 0359  AST 24  ALT 21  ALKPHOS 108  BILITOT 0.5  PROT 6.5  ALBUMIN 1.7*   No results for input(s): LIPASE, AMYLASE in the last 168 hours. No results for input(s): AMMONIA in the last 168 hours.  CBC: Recent Labs  Lab 09/24/21 0337 09/25/21 0315 09/26/21 0913 09/27/21 0359 09/29/21 0400  WBC 6.7 7.3 9.7 7.4 7.4  HGB 8.8* 9.4* 9.4*  9.6* 9.6*  HCT 27.8* 31.3* 30.5* 31.0* 29.3*  MCV 90.3 92.9 91.9 90.6 86.2  PLT 83* 70* 76* 72* 83*    Cardiac Enzymes: No results for input(s): CKTOTAL, CKMB, CKMBINDEX, TROPONINI in the last 168 hours.  BNP (last 3 results) No results for input(s): BNP in the last 8760 hours.  ProBNP (last 3 results) No results for input(s): PROBNP in the last 8760 hours.  Radiological Exams: DG Abd 1 View  Result Date: 09/29/2021 CLINICAL DATA:  Ileus EXAM: ABDOMEN - 1 VIEW COMPARISON:  Four days ago FINDINGS: Enteric tube with tip and side port at the stomach. Normal bowel gas pattern. No concerning mass effect or calcification. Arterial calcifications. IMPRESSION: Unremarkable enteric tube.  Normal bowel gas pattern Electronically Signed   By: 10/01/2021 M.D.   On: 09/29/2021 06:00   IR Paracentesis  Result Date: 09/27/2021 INDICATION: Patient with a history of alcoholic cirrhosis with recurrent ascites presents today for a diagnostic and therapeutic paracentesis. EXAM: ULTRASOUND GUIDED PARACENTESIS MEDICATIONS: 1% lidocaine 10 mL COMPLICATIONS: None immediate. PROCEDURE: Informed written consent was obtained from the patient after a discussion of the risks, benefits and alternatives to treatment. A timeout was performed prior to the initiation of the procedure. Initial ultrasound scanning demonstrates a large amount of ascites within the right lower abdominal quadrant. The  right lower abdomen was prepped and draped in the usual sterile fashion. 1% lidocaine was used for local anesthesia. Following this, a 19 gauge, 7-cm, Yueh catheter was introduced. An ultrasound image was saved for documentation purposes. The paracentesis was performed. The catheter was removed and a dressing was applied. The patient tolerated the procedure well without immediate post procedural complication. FINDINGS: A total of approximately 5.4 L of clear yellow fluid was removed. Samples were sent to the laboratory as  requested by the clinical team. IMPRESSION: Successful ultrasound-guided paracentesis yielding 5.4 liters of peritoneal fluid. Read by: Alwyn Ren, NP Electronically Signed   By: Malachy Moan M.D.   On: 09/27/2021 13:48    Assessment/Plan Active Problems:   Acute on chronic respiratory failure with hypoxia (HCC)   Alcoholic cirrhosis of liver with ascites (HCC)   Acute metabolic encephalopathy   Aspiration pneumonia of both lower lobes due to gastric secretions (HCC)   Alcohol abuse with withdrawal (HCC)   Acute on chronic respiratory failure hypoxia patient is on the T collar Reitnauer will change the trach out and try capping Alcohol cirrhosis status post paracentesis Metabolic encephalopathy no change we will continue with supportive care Aspiration pneumonia has been treated Alcohol withdrawal no sign of active withdrawal right now   I have personally seen and evaluated the patient, evaluated laboratory and imaging results, formulated the assessment and plan and placed orders. The Patient requires high complexity decision making with multiple systems involvement.  Rounds were done with the Respiratory Therapy Director and Staff therapists and discussed with nursing staff also.  Yevonne Pax, MD [facility] Pulmonary Critical Care Medicine Sleep Medicine

## 2021-09-30 LAB — BASIC METABOLIC PANEL
Anion gap: 4 — ABNORMAL LOW (ref 5–15)
BUN: 24 mg/dL — ABNORMAL HIGH (ref 6–20)
CO2: 32 mmol/L (ref 22–32)
Calcium: 9 mg/dL (ref 8.9–10.3)
Chloride: 95 mmol/L — ABNORMAL LOW (ref 98–111)
Creatinine, Ser: 0.54 mg/dL — ABNORMAL LOW (ref 0.61–1.24)
GFR, Estimated: >60 mL/min (ref >60)
Glucose, Bld: 137 mg/dL — ABNORMAL HIGH (ref 70–99)
Potassium: 4.2 mmol/L (ref 3.5–5.1)
Sodium: 131 mmol/L — ABNORMAL LOW (ref 135–145)

## 2021-09-30 LAB — MAGNESIUM: Magnesium: 1.5 mg/dL — ABNORMAL LOW (ref 1.7–2.4)

## 2021-09-30 LAB — PHOSPHORUS: Phosphorus: 3.5 mg/dL (ref 2.5–4.6)

## 2021-10-01 LAB — CBC
HCT: 30.7 % — ABNORMAL LOW (ref 39.0–52.0)
Hemoglobin: 9.9 g/dL — ABNORMAL LOW (ref 13.0–17.0)
MCH: 27.7 pg (ref 26.0–34.0)
MCHC: 32.2 g/dL (ref 30.0–36.0)
MCV: 85.8 fL (ref 80.0–100.0)
Platelets: 94 10*3/uL — ABNORMAL LOW (ref 150–400)
RBC: 3.58 MIL/uL — ABNORMAL LOW (ref 4.22–5.81)
RDW: 15 % (ref 11.5–15.5)
WBC: 7.5 10*3/uL (ref 4.0–10.5)
nRBC: 0 % (ref 0.0–0.2)

## 2021-10-01 LAB — BASIC METABOLIC PANEL
Anion gap: 6 (ref 5–15)
BUN: 17 mg/dL (ref 6–20)
CO2: 29 mmol/L (ref 22–32)
Calcium: 8.7 mg/dL — ABNORMAL LOW (ref 8.9–10.3)
Chloride: 96 mmol/L — ABNORMAL LOW (ref 98–111)
Creatinine, Ser: 0.53 mg/dL — ABNORMAL LOW (ref 0.61–1.24)
GFR, Estimated: >60 mL/min (ref >60)
Glucose, Bld: 134 mg/dL — ABNORMAL HIGH (ref 70–99)
Potassium: 3.6 mmol/L (ref 3.5–5.1)
Sodium: 131 mmol/L — ABNORMAL LOW (ref 135–145)

## 2021-10-01 LAB — PHOSPHORUS: Phosphorus: 3.8 mg/dL (ref 2.5–4.6)

## 2021-10-01 LAB — MAGNESIUM: Magnesium: 1.6 mg/dL — ABNORMAL LOW (ref 1.7–2.4)

## 2021-10-02 LAB — BASIC METABOLIC PANEL
Anion gap: 4 — ABNORMAL LOW (ref 5–15)
BUN: 14 mg/dL (ref 6–20)
CO2: 29 mmol/L (ref 22–32)
Calcium: 8.5 mg/dL — ABNORMAL LOW (ref 8.9–10.3)
Chloride: 98 mmol/L (ref 98–111)
Creatinine, Ser: 0.5 mg/dL — ABNORMAL LOW (ref 0.61–1.24)
GFR, Estimated: >60 mL/min (ref >60)
Glucose, Bld: 126 mg/dL — ABNORMAL HIGH (ref 70–99)
Potassium: 3.8 mmol/L (ref 3.5–5.1)
Sodium: 131 mmol/L — ABNORMAL LOW (ref 135–145)

## 2021-10-02 LAB — CULTURE, BODY FLUID W GRAM STAIN -BOTTLE: Culture: NO GROWTH

## 2021-10-02 LAB — PHOSPHORUS: Phosphorus: 4.2 mg/dL (ref 2.5–4.6)

## 2021-10-02 LAB — MAGNESIUM: Magnesium: 1.9 mg/dL (ref 1.7–2.4)

## 2021-10-03 ENCOUNTER — Other Ambulatory Visit (HOSPITAL_COMMUNITY): Payer: Medicaid Other

## 2021-10-03 LAB — CBC
HCT: 28.8 % — ABNORMAL LOW (ref 39.0–52.0)
Hemoglobin: 9.3 g/dL — ABNORMAL LOW (ref 13.0–17.0)
MCH: 27.8 pg (ref 26.0–34.0)
MCHC: 32.3 g/dL (ref 30.0–36.0)
MCV: 86 fL (ref 80.0–100.0)
Platelets: 122 10*3/uL — ABNORMAL LOW (ref 150–400)
RBC: 3.35 MIL/uL — ABNORMAL LOW (ref 4.22–5.81)
RDW: 15.2 % (ref 11.5–15.5)
WBC: 8.2 10*3/uL (ref 4.0–10.5)
nRBC: 0 % (ref 0.0–0.2)

## 2021-10-03 LAB — TRIGLYCERIDES: Triglycerides: 111 mg/dL (ref <150)

## 2021-10-03 LAB — BASIC METABOLIC PANEL
Anion gap: 4 — ABNORMAL LOW (ref 5–15)
BUN: 15 mg/dL (ref 6–20)
CO2: 29 mmol/L (ref 22–32)
Calcium: 8.8 mg/dL — ABNORMAL LOW (ref 8.9–10.3)
Chloride: 97 mmol/L — ABNORMAL LOW (ref 98–111)
Creatinine, Ser: 0.6 mg/dL — ABNORMAL LOW (ref 0.61–1.24)
GFR, Estimated: >60 mL/min (ref >60)
Glucose, Bld: 120 mg/dL — ABNORMAL HIGH (ref 70–99)
Potassium: 3.8 mmol/L (ref 3.5–5.1)
Sodium: 130 mmol/L — ABNORMAL LOW (ref 135–145)

## 2021-10-03 LAB — MAGNESIUM: Magnesium: 1.6 mg/dL — ABNORMAL LOW (ref 1.7–2.4)

## 2021-10-03 LAB — PHOSPHORUS: Phosphorus: 4.1 mg/dL (ref 2.5–4.6)

## 2021-10-04 ENCOUNTER — Other Ambulatory Visit (HOSPITAL_COMMUNITY): Payer: Medicaid Other

## 2021-10-04 DIAGNOSIS — K7031 Alcoholic cirrhosis of liver with ascites: Secondary | ICD-10-CM | POA: Diagnosis not present

## 2021-10-04 DIAGNOSIS — G9341 Metabolic encephalopathy: Secondary | ICD-10-CM | POA: Diagnosis not present

## 2021-10-04 DIAGNOSIS — J9621 Acute and chronic respiratory failure with hypoxia: Secondary | ICD-10-CM | POA: Diagnosis not present

## 2021-10-04 DIAGNOSIS — F10139 Alcohol abuse with withdrawal, unspecified: Secondary | ICD-10-CM | POA: Diagnosis not present

## 2021-10-04 LAB — SODIUM: Sodium: 131 mmol/L — ABNORMAL LOW (ref 135–145)

## 2021-10-04 LAB — MAGNESIUM: Magnesium: 1.3 mg/dL — ABNORMAL LOW (ref 1.7–2.4)

## 2021-10-04 NOTE — Progress Notes (Signed)
Pulmonary Critical Care Medicine [facility] [place]   PULMONARY CRITICAL CARE SERVICE  PROGRESS NOTE     Leon Davidson  IHK:[id number]  DOB: [birth year]   DOA: 09/07/2021  Referring Physician: Luna Kitchens, MD  HPI: Leon Davidson is a 55 y.o. male being followed for ventilator/airway/oxygen weaning Acute on Chronic Respiratory Failure.  Patient is afebrile right now resting comfortably without distress has been on T collar currently secretions are improving  Medications: Reviewed on Rounds  Physical Exam:  Vitals: Temperature is 97.2 pulse 61 respiratory 16 blood pressure is 136/75 saturations 96%  Ventilator Settings on T collar using PMV  General: Comfortable at this time Neck: supple Cardiovascular: no malignant arrhythmias Respiratory: Scattered rhonchi Skin: no rash seen on limited exam Musculoskeletal: No gross abnormality Psychiatric:unable to assess Neurologic:no involuntary movements         Lab Data:   Basic Metabolic Panel: Recent Labs  Lab 09/29/21 0400 09/30/21 0448 10/01/21 0536 10/02/21 0741 10/03/21 0420 10/04/21 0503  NA 133* 131* 131* 131* 130* 131*  K 3.7 4.2 3.6 3.8 3.8  --   CL 100 95* 96* 98 97*  --   CO2 29 32 29 29 29   --   GLUCOSE 94 137* 134* 126* 120*  --   BUN 19 24* 17 14 15   --   CREATININE 0.54* 0.54* 0.53* 0.50* 0.60*  --   CALCIUM 8.9 9.0 8.7* 8.5* 8.8*  --   MG 1.6* 1.5* 1.6* 1.9 1.6* 1.3*  PHOS 3.4 3.5 3.8 4.2 4.1  --     ABG: No results for input(s): PHART, PCO2ART, PO2ART, HCO3, O2SAT in the last 168 hours.  Liver Function Tests: No results for input(s): AST, ALT, ALKPHOS, BILITOT, PROT, ALBUMIN in the last 168 hours. No results for input(s): LIPASE, AMYLASE in the last 168 hours. No results for input(s): AMMONIA in the last 168 hours.  CBC: Recent Labs  Lab 09/29/21 0400 10/01/21 0536 10/03/21 0420  WBC 7.4 7.5 8.2  HGB 9.6* 9.9* 9.3*  HCT 29.3* 30.7* 28.8*  MCV 86.2 85.8 86.0  PLT 83* 94*  122*    Cardiac Enzymes: No results for input(s): CKTOTAL, CKMB, CKMBINDEX, TROPONINI in the last 168 hours.  BNP (last 3 results) No results for input(s): BNP in the last 8760 hours.  ProBNP (last 3 results) No results for input(s): PROBNP in the last 8760 hours.  Radiological Exams: DG Chest Port 1 View  Result Date: 10/03/2021 CLINICAL DATA:  55 year old male with history of pneumonia. EXAM: PORTABLE CHEST 1 VIEW COMPARISON:  Chest x-ray 09/27/2021. FINDINGS: A tracheostomy tube is in place with tip 5.7 cm above the carina. A nasogastric tube is seen extending into the stomach, however, the tip of the nasogastric tube extends below the lower margin of the image. Opacity at the left base compatible with atelectasis and/or consolidation. Small left pleural effusion. Right lung is clear. No right pleural effusion. No pneumothorax. No evidence of pulmonary edema. Heart size is mildly enlarged. Upper mediastinal contours are within normal limits. Status post median sternotomy. IMPRESSION: 1. Persistent atelectasis and/or consolidation in the left lower lobe with small left pleural effusion. 2. Support apparatus and postoperative changes, as above. Electronically Signed   By: 40 M.D.   On: 10/03/2021 06:19    Assessment/Plan Active Problems:   Acute on chronic respiratory failure with hypoxia (HCC)   Alcoholic cirrhosis of liver with ascites (HCC)   Acute metabolic encephalopathy   Aspiration pneumonia of both lower lobes  due to gastric secretions (HCC)   Alcohol abuse with withdrawal (HCC)   Acute on chronic respiratory failure hypoxia has done well with the T collar plan to advance weaning and start using capping trials Alcohol cirrhosis supportive care we will continue to follow along closely Metabolic encephalopathy no change continue with present therapy Aspiration pneumonia treated resolved Alcohol withdrawal no change   I have personally seen and evaluated the  patient, evaluated laboratory and imaging results, formulated the assessment and plan and placed orders. The Patient requires high complexity decision making with multiple systems involvement.  Rounds were done with the Respiratory Therapy Director and Staff therapists and discussed with nursing staff also.  Yevonne Pax, MD [facility] Pulmonary Critical Care Medicine Sleep Medicine

## 2021-10-05 ENCOUNTER — Other Ambulatory Visit (HOSPITAL_COMMUNITY): Payer: Medicaid Other

## 2021-10-05 DIAGNOSIS — J9621 Acute and chronic respiratory failure with hypoxia: Secondary | ICD-10-CM | POA: Diagnosis not present

## 2021-10-05 DIAGNOSIS — G9341 Metabolic encephalopathy: Secondary | ICD-10-CM | POA: Diagnosis not present

## 2021-10-05 DIAGNOSIS — K7031 Alcoholic cirrhosis of liver with ascites: Secondary | ICD-10-CM | POA: Diagnosis not present

## 2021-10-05 DIAGNOSIS — F10139 Alcohol abuse with withdrawal, unspecified: Secondary | ICD-10-CM | POA: Diagnosis not present

## 2021-10-05 LAB — BASIC METABOLIC PANEL WITH GFR
Anion gap: 6 (ref 5–15)
BUN: 11 mg/dL (ref 6–20)
CO2: 26 mmol/L (ref 22–32)
Calcium: 8.7 mg/dL — ABNORMAL LOW (ref 8.9–10.3)
Chloride: 99 mmol/L (ref 98–111)
Creatinine, Ser: 0.48 mg/dL — ABNORMAL LOW (ref 0.61–1.24)
GFR, Estimated: >60 mL/min
Glucose, Bld: 163 mg/dL — ABNORMAL HIGH (ref 70–99)
Potassium: 3.5 mmol/L (ref 3.5–5.1)
Sodium: 131 mmol/L — ABNORMAL LOW (ref 135–145)

## 2021-10-05 LAB — MAGNESIUM: Magnesium: 1.8 mg/dL (ref 1.7–2.4)

## 2021-10-05 LAB — CBC
HCT: 30.9 % — ABNORMAL LOW (ref 39.0–52.0)
Hemoglobin: 9.9 g/dL — ABNORMAL LOW (ref 13.0–17.0)
MCH: 27.3 pg (ref 26.0–34.0)
MCHC: 32 g/dL (ref 30.0–36.0)
MCV: 85.1 fL (ref 80.0–100.0)
Platelets: 136 10*3/uL — ABNORMAL LOW (ref 150–400)
RBC: 3.63 MIL/uL — ABNORMAL LOW (ref 4.22–5.81)
RDW: 14.8 % (ref 11.5–15.5)
WBC: 9.4 10*3/uL (ref 4.0–10.5)
nRBC: 0 % (ref 0.0–0.2)

## 2021-10-05 NOTE — Progress Notes (Signed)
Pulmonary Critical Care Medicine [facility] [place]   PULMONARY CRITICAL CARE SERVICE  PROGRESS NOTE     Leon Davidson  NUU:[id number]  DOB: 1966/07/25   DOA: 09/07/2021  Referring Physician: Luna Kitchens, MD  HPI: Leon Davidson is a 55 y.o. male being followed for ventilator/airway/oxygen weaning Acute on Chronic Respiratory Failure.  He is awake alert comfortable without any distress at this time patient has been capping secretions are reportedly minimal  Medications: Reviewed on Rounds  Physical Exam:  Vitals: Temperature is 97.6 pulse 78 respiratory rate is 13 blood pressure is 179/96 saturations 94%  Ventilator Settings capping on room air  General: Comfortable at this time Neck: supple Cardiovascular: no malignant arrhythmias Respiratory: Scattered rhonchi expansion is equal Skin: no rash seen on limited exam Musculoskeletal: No gross abnormality Psychiatric:unable to assess Neurologic:no involuntary movements         Lab Data:   Basic Metabolic Panel: Recent Labs  Lab 09/29/21 0400 09/30/21 0448 10/01/21 0536 10/02/21 0741 10/03/21 0420 10/04/21 0503 10/05/21 0423  NA 133* 131* 131* 131* 130* 131* 131*  K 3.7 4.2 3.6 3.8 3.8  --  3.5  CL 100 95* 96* 98 97*  --  99  CO2 29 32 29 29 29   --  26  GLUCOSE 94 137* 134* 126* 120*  --  163*  BUN 19 24* 17 14 15   --  11  CREATININE 0.54* 0.54* 0.53* 0.50* 0.60*  --  0.48*  CALCIUM 8.9 9.0 8.7* 8.5* 8.8*  --  8.7*  MG 1.6* 1.5* 1.6* 1.9 1.6* 1.3* 1.8  PHOS 3.4 3.5 3.8 4.2 4.1  --   --     ABG: No results for input(s): PHART, PCO2ART, PO2ART, HCO3, O2SAT in the last 168 hours.  Liver Function Tests: No results for input(s): AST, ALT, ALKPHOS, BILITOT, PROT, ALBUMIN in the last 168 hours. No results for input(s): LIPASE, AMYLASE in the last 168 hours. No results for input(s): AMMONIA in the last 168 hours.  CBC: Recent Labs  Lab 09/29/21 0400 10/01/21 0536 10/03/21 0420 10/05/21 0423   WBC 7.4 7.5 8.2 9.4  HGB 9.6* 9.9* 9.3* 9.9*  HCT 29.3* 30.7* 28.8* 30.9*  MCV 86.2 85.8 86.0 85.1  PLT 83* 94* 122* 136*    Cardiac Enzymes: No results for input(s): CKTOTAL, CKMB, CKMBINDEX, TROPONINI in the last 168 hours.  BNP (last 3 results) No results for input(s): BNP in the last 8760 hours.  ProBNP (last 3 results) No results for input(s): PROBNP in the last 8760 hours.  Radiological Exams: No results found.  Assessment/Plan Active Problems:   Acute on chronic respiratory failure with hypoxia (HCC)   Alcoholic cirrhosis of liver with ascites (HCC)   Acute metabolic encephalopathy   Aspiration pneumonia of both lower lobes due to gastric secretions (HCC)   Alcohol abuse with withdrawal (HCC)   Acute on chronic respiratory failure hypoxia we will continue with capping trials hopefully will be able to consider decannulation soon Alcohol cirrhosis patient is status post paracentesis for the ascites Metabolic encephalopathy improving slowly Aspiration pneumonia treated again slow improvement we will continue to follow Alcohol abuse no sign of active withdrawal   I have personally seen and evaluated the patient, evaluated laboratory and imaging results, formulated the assessment and plan and placed orders. The Patient requires high complexity decision making with multiple systems involvement.  Rounds were done with the Respiratory Therapy Director and Staff therapists and discussed with nursing staff also.  10/05/21,  MD [facility] Pulmonary Critical Care Medicine Sleep Medicine

## 2021-10-06 DIAGNOSIS — F10139 Alcohol abuse with withdrawal, unspecified: Secondary | ICD-10-CM | POA: Diagnosis not present

## 2021-10-06 DIAGNOSIS — G9341 Metabolic encephalopathy: Secondary | ICD-10-CM | POA: Diagnosis not present

## 2021-10-06 DIAGNOSIS — K7031 Alcoholic cirrhosis of liver with ascites: Secondary | ICD-10-CM | POA: Diagnosis not present

## 2021-10-06 DIAGNOSIS — J9621 Acute and chronic respiratory failure with hypoxia: Secondary | ICD-10-CM | POA: Diagnosis not present

## 2021-10-06 NOTE — Progress Notes (Signed)
Pulmonary Critical Care Medicine [facility] [place]   PULMONARY CRITICAL CARE SERVICE  PROGRESS NOTE     Leon Davidson  FTD:[id number]  DOB: [birth year]   DOA: 09/07/2021  Referring Physician: Luna Kitchens, MD  HPI: Leon Davidson is a 55 y.o. male being followed for ventilator/airway/oxygen weaning Acute on Chronic Respiratory Failure.  Patient is doing well no fevers are noted has been capping ready for decannulation  Medications: Reviewed on Rounds  Physical Exam:  Vitals: Temperature is 97.2 pulse 62 respiratory 26 blood pressure is 148/79 saturations 94%  Ventilator Settings capping off the ventilator  General: Comfortable at this time Neck: supple Cardiovascular: no malignant arrhythmias Respiratory: No rhonchi no rales are noted Skin: no rash seen on limited exam Musculoskeletal: No gross abnormality Psychiatric:unable to assess Neurologic:no involuntary movements         Lab Data:   Basic Metabolic Panel: Recent Labs  Lab 09/30/21 0448 10/01/21 0536 10/02/21 0741 10/03/21 0420 10/04/21 0503 10/05/21 0423  NA 131* 131* 131* 130* 131* 131*  K 4.2 3.6 3.8 3.8  --  3.5  CL 95* 96* 98 97*  --  99  CO2 32 29 29 29   --  26  GLUCOSE 137* 134* 126* 120*  --  163*  BUN 24* 17 14 15   --  11  CREATININE 0.54* 0.53* 0.50* 0.60*  --  0.48*  CALCIUM 9.0 8.7* 8.5* 8.8*  --  8.7*  MG 1.5* 1.6* 1.9 1.6* 1.3* 1.8  PHOS 3.5 3.8 4.2 4.1  --   --     ABG: No results for input(s): PHART, PCO2ART, PO2ART, HCO3, O2SAT in the last 168 hours.  Liver Function Tests: No results for input(s): AST, ALT, ALKPHOS, BILITOT, PROT, ALBUMIN in the last 168 hours. No results for input(s): LIPASE, AMYLASE in the last 168 hours. No results for input(s): AMMONIA in the last 168 hours.  CBC: Recent Labs  Lab 10/01/21 0536 10/03/21 0420 10/05/21 0423  WBC 7.5 8.2 9.4  HGB 9.9* 9.3* 9.9*  HCT 30.7* 28.8* 30.9*  MCV 85.8 86.0 85.1  PLT 94* 122* 136*    Cardiac  Enzymes: No results for input(s): CKTOTAL, CKMB, CKMBINDEX, TROPONINI in the last 168 hours.  BNP (last 3 results) No results for input(s): BNP in the last 8760 hours.  ProBNP (last 3 results) No results for input(s): PROBNP in the last 8760 hours.  Radiological Exams: DG Shoulder Left  Result Date: 10/05/2021 CLINICAL DATA:  Left shoulder pain and swelling. EXAM: LEFT SHOULDER - 2+ VIEW COMPARISON:  None. FINDINGS: No fracture or bone lesion. Glenohumeral and AC joints are normally aligned. No significant degenerative/arthropathic changes. Soft tissues are unremarkable. IMPRESSION: Negative. Electronically Signed   By: 13/02/22 M.D.   On: 10/05/2021 16:32    Assessment/Plan Active Problems:   Acute on chronic respiratory failure with hypoxia (HCC)   Alcoholic cirrhosis of liver with ascites (HCC)   Acute metabolic encephalopathy   Aspiration pneumonia of both lower lobes due to gastric secretions (HCC)   Alcohol abuse with withdrawal (HCC)   Acute on chronic respiratory failure with hypoxia we will continue with capping trials and advance to decannulation Alcohol cirrhosis no change continue with supportive care Metabolic encephalopathy no change Aspiration pneumonia improved Alcohol abuse no sign of active withdrawal   I have personally seen and evaluated the patient, evaluated laboratory and imaging results, formulated the assessment and plan and placed orders. The Patient requires high complexity decision making with multiple systems involvement.  Rounds were done with the Respiratory Therapy Director and Staff therapists and discussed with nursing staff also.  Allyne Gee, MD [facility] Pulmonary Critical Care Medicine Sleep Medicine

## 2021-10-08 LAB — CBC
HCT: 30.2 % — ABNORMAL LOW (ref 39.0–52.0)
Hemoglobin: 10 g/dL — ABNORMAL LOW (ref 13.0–17.0)
MCH: 27.7 pg (ref 26.0–34.0)
MCHC: 33.1 g/dL (ref 30.0–36.0)
MCV: 83.7 fL (ref 80.0–100.0)
Platelets: 163 10*3/uL (ref 150–400)
RBC: 3.61 MIL/uL — ABNORMAL LOW (ref 4.22–5.81)
RDW: 14.6 % (ref 11.5–15.5)
WBC: 9.7 10*3/uL (ref 4.0–10.5)
nRBC: 0 % (ref 0.0–0.2)

## 2021-10-08 LAB — BASIC METABOLIC PANEL
Anion gap: 8 (ref 5–15)
BUN: 13 mg/dL (ref 6–20)
CO2: 26 mmol/L (ref 22–32)
Calcium: 8.9 mg/dL (ref 8.9–10.3)
Chloride: 96 mmol/L — ABNORMAL LOW (ref 98–111)
Creatinine, Ser: 0.54 mg/dL — ABNORMAL LOW (ref 0.61–1.24)
GFR, Estimated: >60 mL/min (ref >60)
Glucose, Bld: 117 mg/dL — ABNORMAL HIGH (ref 70–99)
Potassium: 3.6 mmol/L (ref 3.5–5.1)
Sodium: 130 mmol/L — ABNORMAL LOW (ref 135–145)

## 2021-10-08 LAB — MAGNESIUM: Magnesium: 1.1 mg/dL — ABNORMAL LOW (ref 1.7–2.4)

## 2021-10-09 LAB — MAGNESIUM: Magnesium: 1.3 mg/dL — ABNORMAL LOW (ref 1.7–2.4)

## 2021-10-10 LAB — TRIGLYCERIDES: Triglycerides: 65 mg/dL (ref <150)

## 2021-10-10 LAB — MAGNESIUM: Magnesium: 1.4 mg/dL — ABNORMAL LOW (ref 1.7–2.4)

## 2021-10-11 ENCOUNTER — Other Ambulatory Visit (HOSPITAL_COMMUNITY): Payer: Medicaid Other

## 2021-10-11 LAB — BASIC METABOLIC PANEL
Anion gap: 7 (ref 5–15)
BUN: 10 mg/dL (ref 6–20)
CO2: 28 mmol/L (ref 22–32)
Calcium: 9 mg/dL (ref 8.9–10.3)
Chloride: 96 mmol/L — ABNORMAL LOW (ref 98–111)
Creatinine, Ser: 0.6 mg/dL — ABNORMAL LOW (ref 0.61–1.24)
GFR, Estimated: >60 mL/min (ref >60)
Glucose, Bld: 94 mg/dL (ref 70–99)
Potassium: 3.7 mmol/L (ref 3.5–5.1)
Sodium: 131 mmol/L — ABNORMAL LOW (ref 135–145)

## 2021-10-11 LAB — CBC
HCT: 29.2 % — ABNORMAL LOW (ref 39.0–52.0)
Hemoglobin: 9.5 g/dL — ABNORMAL LOW (ref 13.0–17.0)
MCH: 27.4 pg (ref 26.0–34.0)
MCHC: 32.5 g/dL (ref 30.0–36.0)
MCV: 84.1 fL (ref 80.0–100.0)
Platelets: 146 10*3/uL — ABNORMAL LOW (ref 150–400)
RBC: 3.47 MIL/uL — ABNORMAL LOW (ref 4.22–5.81)
RDW: 14.6 % (ref 11.5–15.5)
WBC: 7.3 10*3/uL (ref 4.0–10.5)
nRBC: 0 % (ref 0.0–0.2)

## 2021-10-11 LAB — TROPONIN I (HIGH SENSITIVITY): Troponin I (High Sensitivity): 10 ng/L (ref <18)

## 2021-10-11 LAB — MAGNESIUM: Magnesium: 1.3 mg/dL — ABNORMAL LOW (ref 1.7–2.4)

## 2021-10-11 LAB — PHOSPHORUS: Phosphorus: 3.6 mg/dL (ref 2.5–4.6)

## 2021-10-12 ENCOUNTER — Other Ambulatory Visit (HOSPITAL_COMMUNITY): Payer: Medicaid Other

## 2021-10-12 LAB — BASIC METABOLIC PANEL
Anion gap: 8 (ref 5–15)
BUN: 9 mg/dL (ref 6–20)
CO2: 27 mmol/L (ref 22–32)
Calcium: 8.6 mg/dL — ABNORMAL LOW (ref 8.9–10.3)
Chloride: 95 mmol/L — ABNORMAL LOW (ref 98–111)
Creatinine, Ser: 0.54 mg/dL — ABNORMAL LOW (ref 0.61–1.24)
GFR, Estimated: >60 mL/min (ref >60)
Glucose, Bld: 99 mg/dL (ref 70–99)
Potassium: 3.6 mmol/L (ref 3.5–5.1)
Sodium: 130 mmol/L — ABNORMAL LOW (ref 135–145)

## 2021-10-12 LAB — PHOSPHORUS: Phosphorus: 3.5 mg/dL (ref 2.5–4.6)

## 2021-10-12 LAB — MAGNESIUM: Magnesium: 1.9 mg/dL (ref 1.7–2.4)

## 2021-10-13 ENCOUNTER — Other Ambulatory Visit (HOSPITAL_COMMUNITY): Payer: Medicaid Other

## 2021-10-13 LAB — BASIC METABOLIC PANEL
Anion gap: 8 (ref 5–15)
BUN: 18 mg/dL (ref 6–20)
CO2: 27 mmol/L (ref 22–32)
Calcium: 8.9 mg/dL (ref 8.9–10.3)
Chloride: 96 mmol/L — ABNORMAL LOW (ref 98–111)
Creatinine, Ser: 0.52 mg/dL — ABNORMAL LOW (ref 0.61–1.24)
GFR, Estimated: >60 mL/min (ref >60)
Glucose, Bld: 104 mg/dL — ABNORMAL HIGH (ref 70–99)
Potassium: 3.9 mmol/L (ref 3.5–5.1)
Sodium: 131 mmol/L — ABNORMAL LOW (ref 135–145)

## 2021-10-13 LAB — CBC
HCT: 30 % — ABNORMAL LOW (ref 39.0–52.0)
Hemoglobin: 9.6 g/dL — ABNORMAL LOW (ref 13.0–17.0)
MCH: 26.9 pg (ref 26.0–34.0)
MCHC: 32 g/dL (ref 30.0–36.0)
MCV: 84 fL (ref 80.0–100.0)
Platelets: 155 10*3/uL (ref 150–400)
RBC: 3.57 MIL/uL — ABNORMAL LOW (ref 4.22–5.81)
RDW: 14.6 % (ref 11.5–15.5)
WBC: 7.4 10*3/uL (ref 4.0–10.5)
nRBC: 0 % (ref 0.0–0.2)

## 2021-10-13 LAB — MAGNESIUM: Magnesium: 1.4 mg/dL — ABNORMAL LOW (ref 1.7–2.4)

## 2021-10-13 LAB — PHOSPHORUS: Phosphorus: 3.8 mg/dL (ref 2.5–4.6)

## 2021-10-14 LAB — BASIC METABOLIC PANEL
Anion gap: 6 (ref 5–15)
BUN: 18 mg/dL (ref 6–20)
CO2: 30 mmol/L (ref 22–32)
Calcium: 8.8 mg/dL — ABNORMAL LOW (ref 8.9–10.3)
Chloride: 95 mmol/L — ABNORMAL LOW (ref 98–111)
Creatinine, Ser: 0.56 mg/dL — ABNORMAL LOW (ref 0.61–1.24)
GFR, Estimated: >60 mL/min (ref >60)
Glucose, Bld: 88 mg/dL (ref 70–99)
Potassium: 3.9 mmol/L (ref 3.5–5.1)
Sodium: 131 mmol/L — ABNORMAL LOW (ref 135–145)

## 2021-10-14 LAB — MAGNESIUM: Magnesium: 1.7 mg/dL (ref 1.7–2.4)

## 2021-10-14 LAB — PHOSPHORUS: Phosphorus: 3.8 mg/dL (ref 2.5–4.6)

## 2021-10-15 LAB — MAGNESIUM: Magnesium: 1.7 mg/dL (ref 1.7–2.4)

## 2021-10-16 LAB — BASIC METABOLIC PANEL
Anion gap: 10 (ref 5–15)
BUN: 19 mg/dL (ref 6–20)
CO2: 26 mmol/L (ref 22–32)
Calcium: 8.9 mg/dL (ref 8.9–10.3)
Chloride: 96 mmol/L — ABNORMAL LOW (ref 98–111)
Creatinine, Ser: 0.63 mg/dL (ref 0.61–1.24)
GFR, Estimated: >60 mL/min (ref >60)
Glucose, Bld: 113 mg/dL — ABNORMAL HIGH (ref 70–99)
Potassium: 3.8 mmol/L (ref 3.5–5.1)
Sodium: 132 mmol/L — ABNORMAL LOW (ref 135–145)

## 2021-10-16 LAB — CBC
HCT: 28.7 % — ABNORMAL LOW (ref 39.0–52.0)
Hemoglobin: 9 g/dL — ABNORMAL LOW (ref 13.0–17.0)
MCH: 26.8 pg (ref 26.0–34.0)
MCHC: 31.4 g/dL (ref 30.0–36.0)
MCV: 85.4 fL (ref 80.0–100.0)
Platelets: 149 10*3/uL — ABNORMAL LOW (ref 150–400)
RBC: 3.36 MIL/uL — ABNORMAL LOW (ref 4.22–5.81)
RDW: 14.9 % (ref 11.5–15.5)
WBC: 6.9 10*3/uL (ref 4.0–10.5)
nRBC: 0 % (ref 0.0–0.2)

## 2021-10-16 LAB — MAGNESIUM: Magnesium: 1.6 mg/dL — ABNORMAL LOW (ref 1.7–2.4)

## 2021-10-16 LAB — PHOSPHORUS: Phosphorus: 4.1 mg/dL (ref 2.5–4.6)

## 2021-10-17 LAB — MAGNESIUM: Magnesium: 1.8 mg/dL (ref 1.7–2.4)

## 2021-10-17 LAB — TRIGLYCERIDES: Triglycerides: 60 mg/dL (ref <150)

## 2021-11-04 ENCOUNTER — Encounter (HOSPITAL_COMMUNITY): Payer: Self-pay | Admitting: Radiology

## 2021-11-11 ENCOUNTER — Telehealth: Payer: Self-pay

## 2021-11-11 NOTE — Telephone Encounter (Signed)
Completed medical records for [employer]. Faxed records 11-11-21 to [phone], claim number [id number]

## 2021-11-17 ENCOUNTER — Telehealth: Payer: Self-pay

## 2021-11-17 NOTE — Telephone Encounter (Signed)
Completed medical records for [facility] Faxed to [phone] on 11-11-2021  Claim number [id number]

## 2023-02-24 IMAGING — DX DG SHOULDER 2+V PORT*R*
1 series · 2 of 2 positions shown · non-contrast
Comparison: None.

CLINICAL DATA: Right shoulder pain without trauma

EXAM:
PORTABLE RIGHT SHOULDER

[Series 1: shoulder · 0.14mm/px · 2 of 2 slices shown]
[im 1/2]
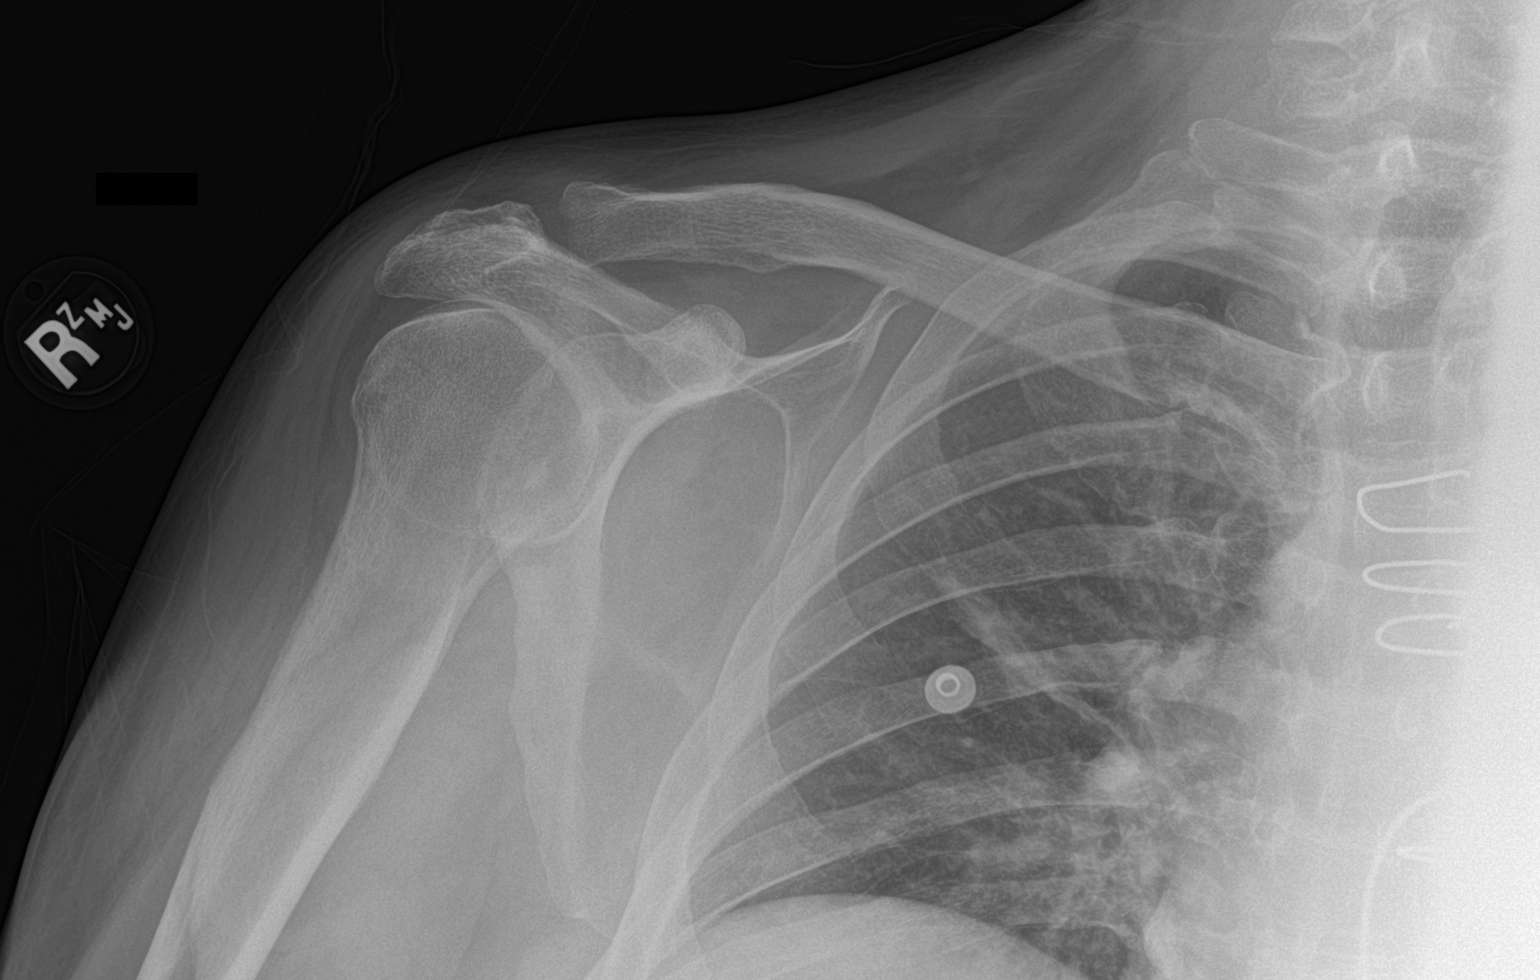
[im 2/2]
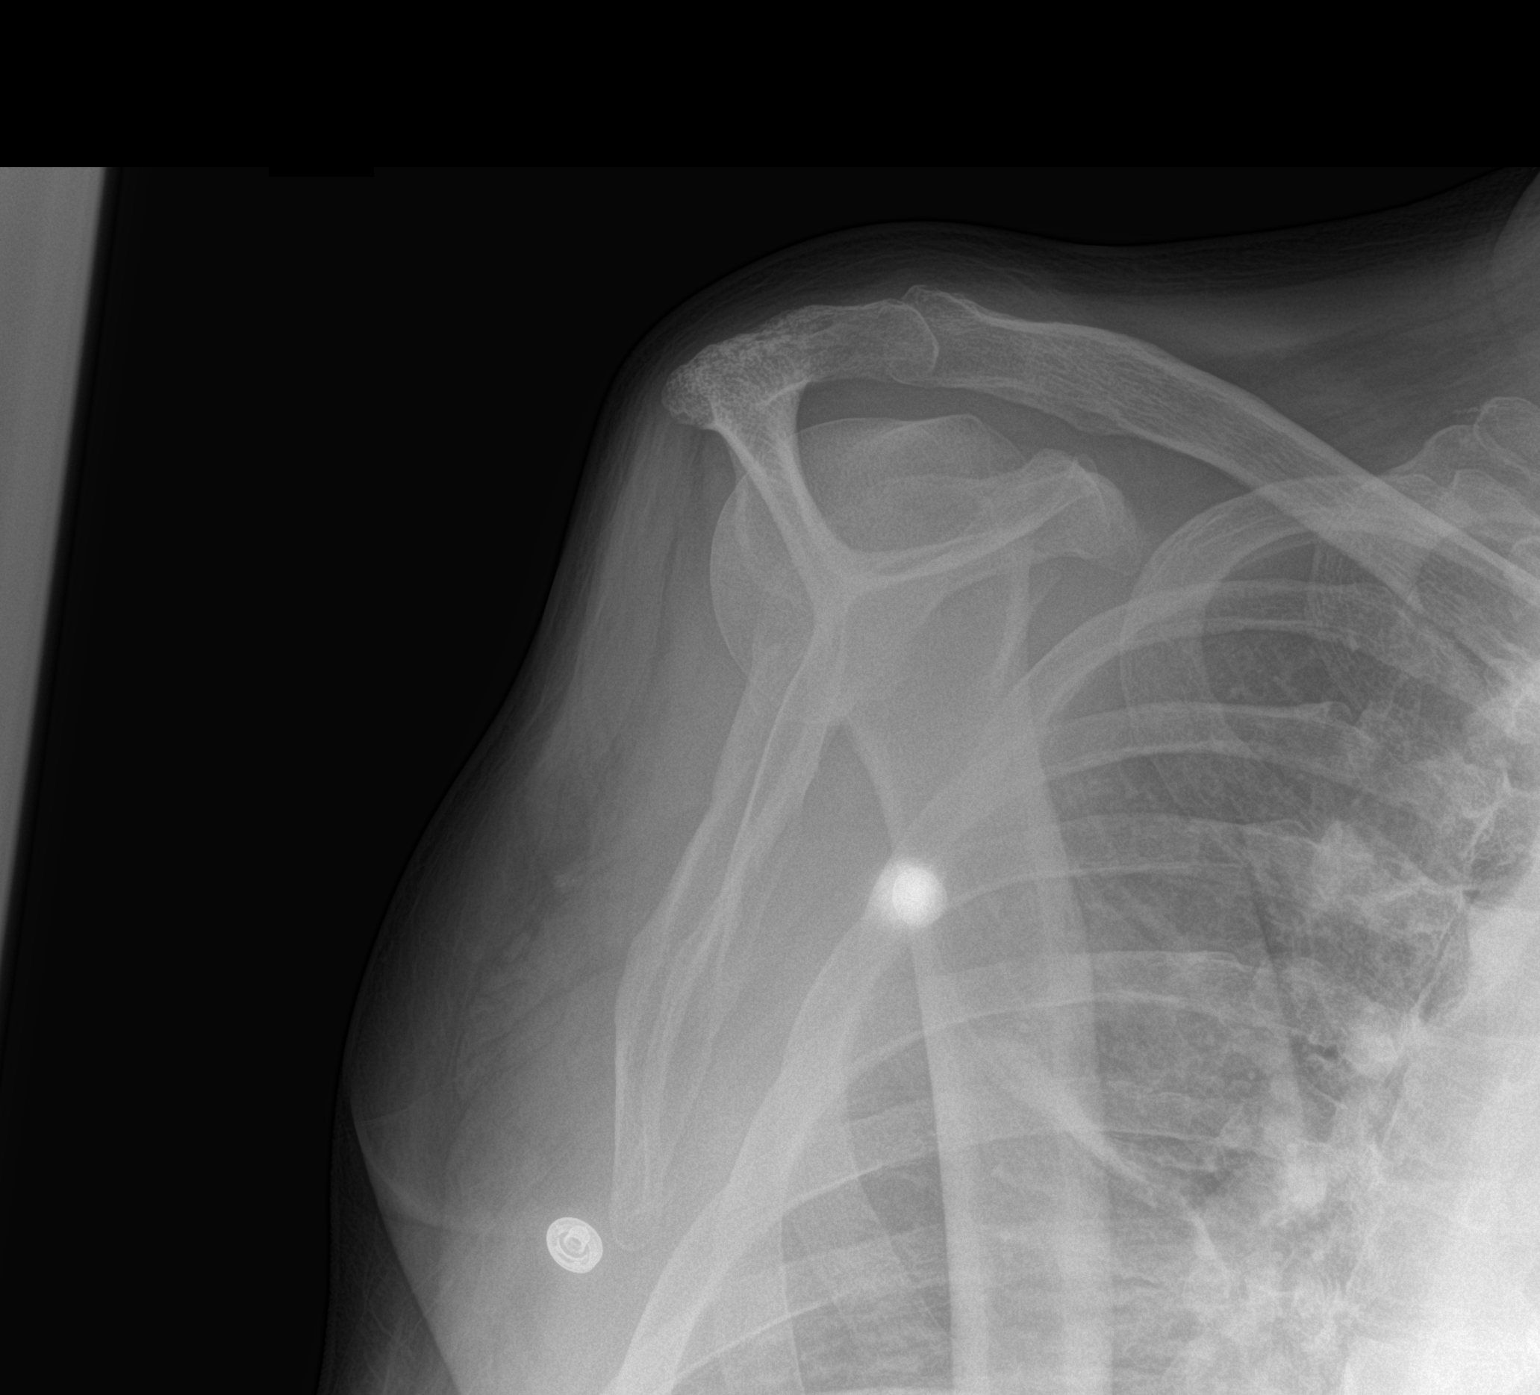

[2 of 2 positions shown; findings below may reference images not displayed]

FINDINGS: Internal rotation and scapular views demonstrate no fracture or
dislocation. Mild degenerative changes involve the undersurface of
the acromioclavicular joint. Visualized portion of the right
hemithorax is normal. Median sternotomy wires are incompletely
imaged.
IMPRESSION: No acute osseous abnormality.
